# Patient Record
Sex: Male | Born: 1950 | Race: White | Hispanic: No | Marital: Married | Smoking: Former smoker
Health system: Southern US, Community
[De-identification: ages and names within clinical notes are randomized; demographics above are authoritative.]

## PROBLEM LIST (undated history)

## (undated) DIAGNOSIS — I10 Essential (primary) hypertension: Secondary | ICD-10-CM

## (undated) DIAGNOSIS — Z8639 Personal history of other endocrine, nutritional and metabolic disease: Secondary | ICD-10-CM

## (undated) DIAGNOSIS — B019 Varicella without complication: Secondary | ICD-10-CM

## (undated) DIAGNOSIS — M722 Plantar fascial fibromatosis: Secondary | ICD-10-CM

## (undated) HISTORY — DX: Plantar fascial fibromatosis: M72.2

## (undated) HISTORY — DX: Personal history of other endocrine, nutritional and metabolic disease: Z86.39

## (undated) HISTORY — PX: TONSILLECTOMY AND ADENOIDECTOMY: SHX28

## (undated) HISTORY — PX: KNEE ARTHROSCOPY: SUR90

## (undated) HISTORY — PX: TONSILLECTOMY: SUR1361

## (undated) HISTORY — DX: Varicella without complication: B01.9

## (undated) HISTORY — DX: Essential (primary) hypertension: I10

## (undated) HISTORY — PX: OTHER SURGICAL HISTORY: SHX169

---

## 2004-04-03 ENCOUNTER — Other Ambulatory Visit: Payer: Self-pay

## 2007-11-18 HISTORY — PX: FOOT SURGERY: SHX648

## 2008-01-14 ENCOUNTER — Ambulatory Visit: Payer: Self-pay | Admitting: Internal Medicine

## 2008-06-08 ENCOUNTER — Ambulatory Visit: Payer: Self-pay | Admitting: Internal Medicine

## 2009-07-16 IMAGING — US ABDOMEN ULTRASOUND
1 series · 17 of 25 positions shown · non-contrast
Comparison: none

REASON FOR EXAM: Elevated LFT's, elevated Bilirubin
COMMENTS:

[Series 1: abdomen ultrasound · 17 of 57 slices shown]
[im 1/57]
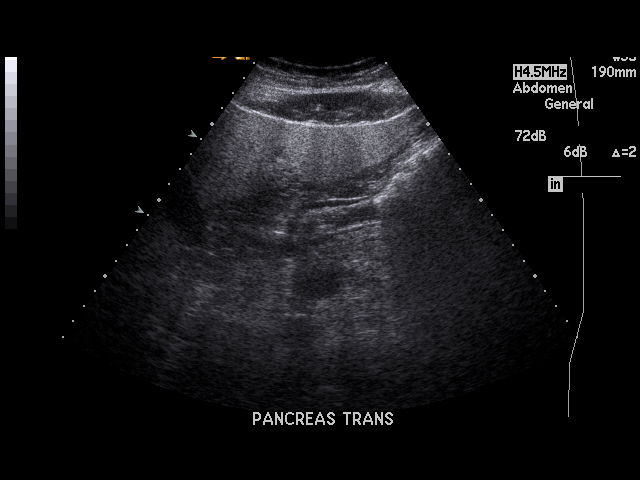
[im 5/57]
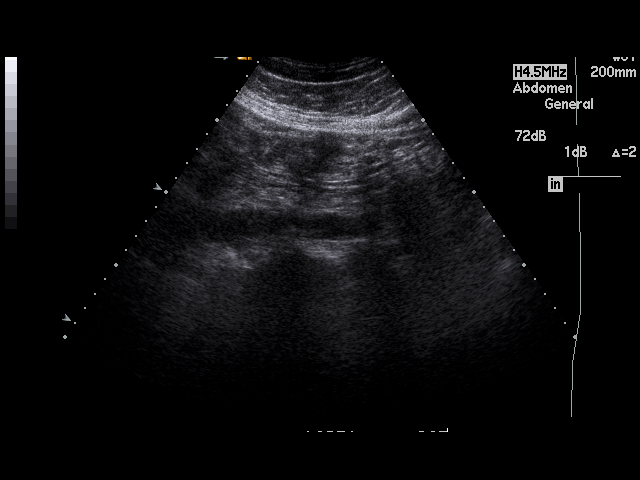
[im 8/57]
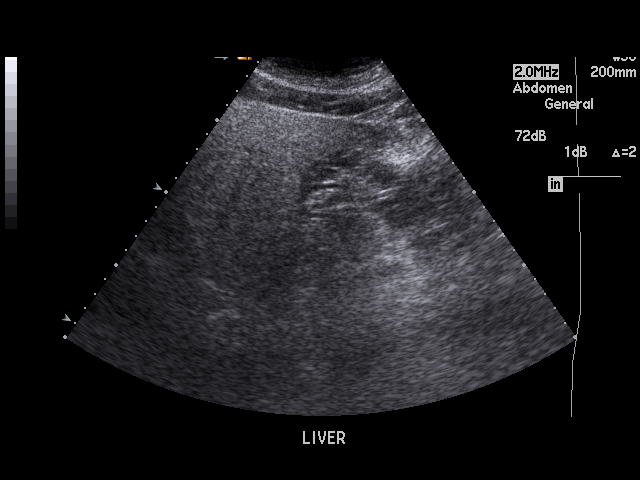
[im 12/57]
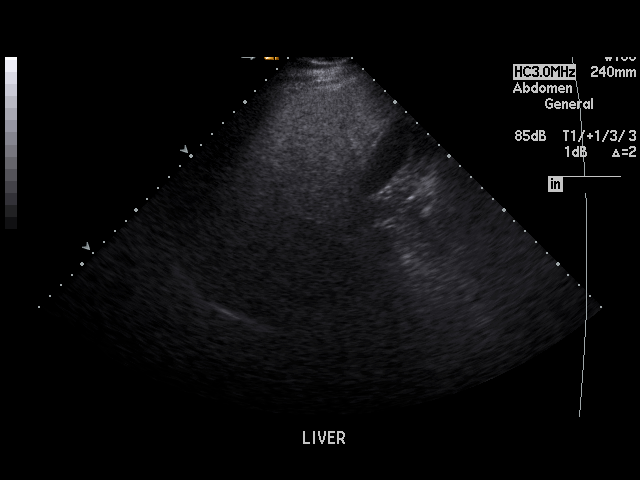
[im 15/57]
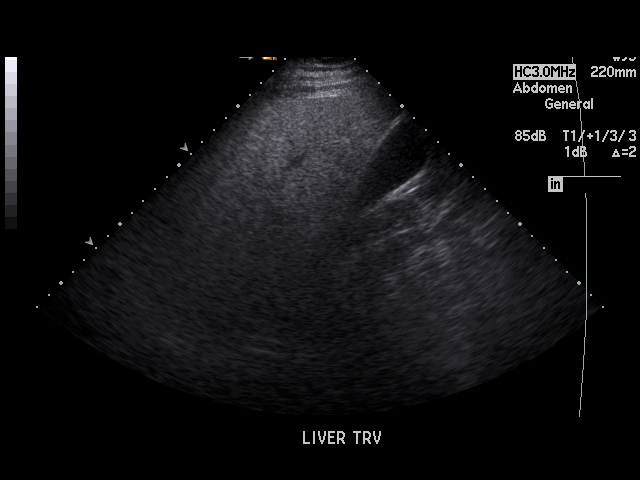
[im 19/57]
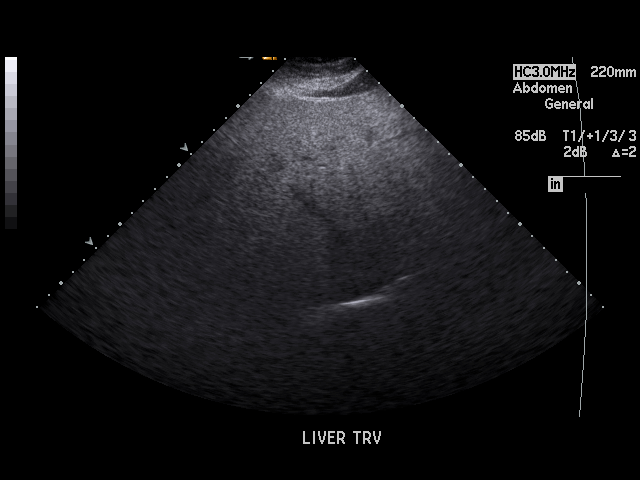
[im 22/57]
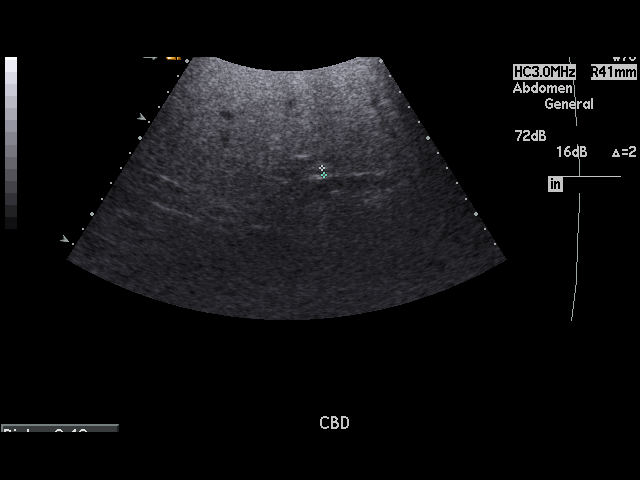
[im 26/57]
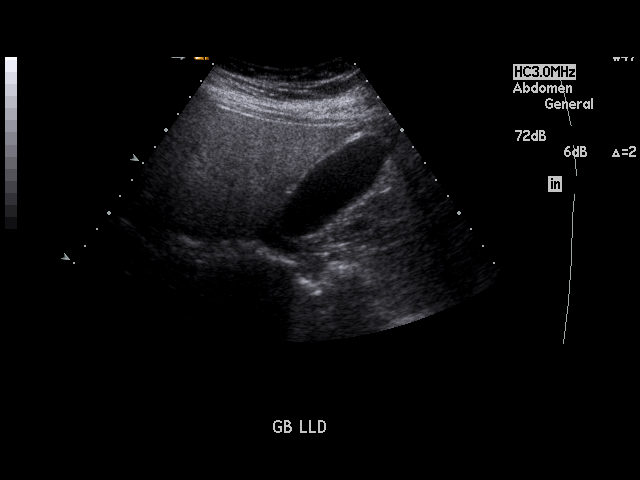
[im 29/57]
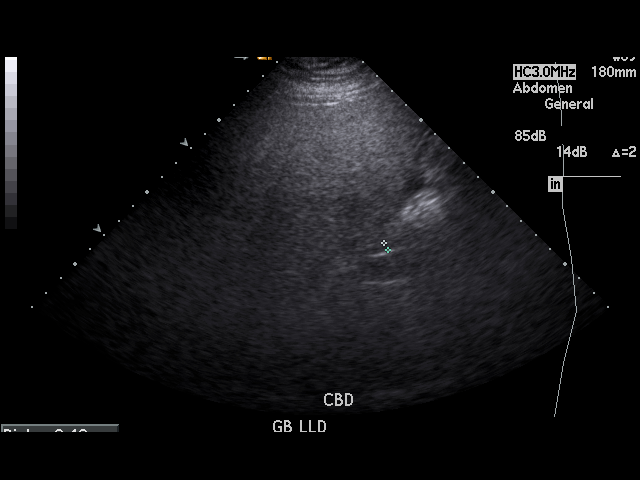
[im 31/57]
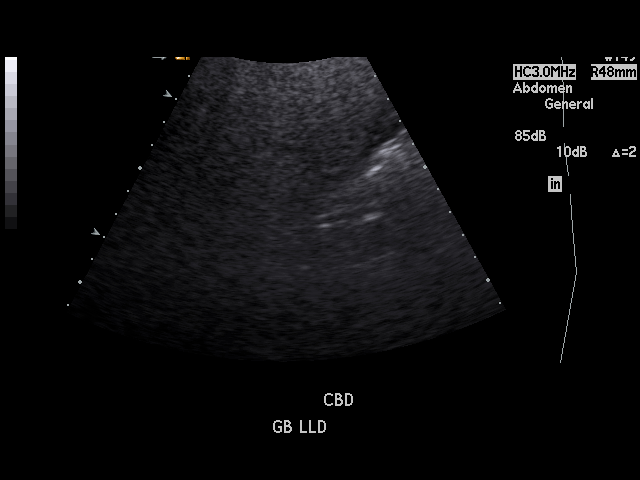
[im 36/57]
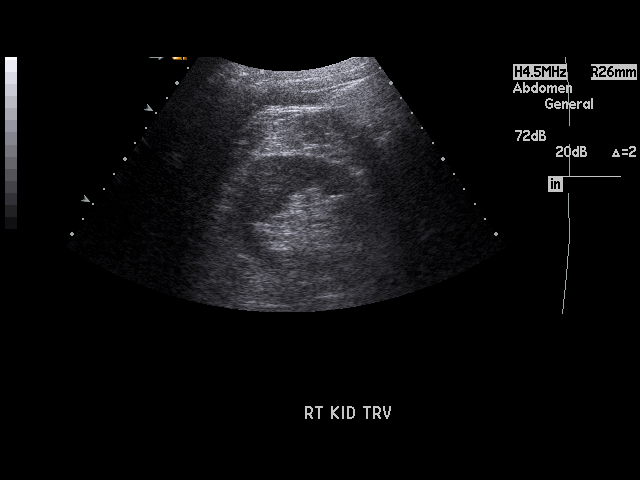
[im 38/57]
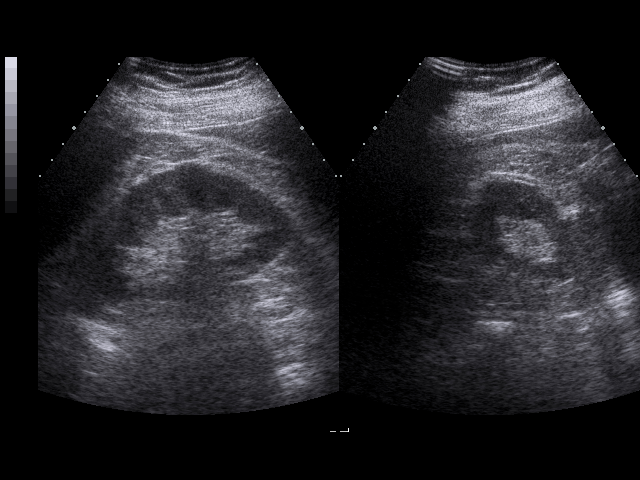
[im 43/57]
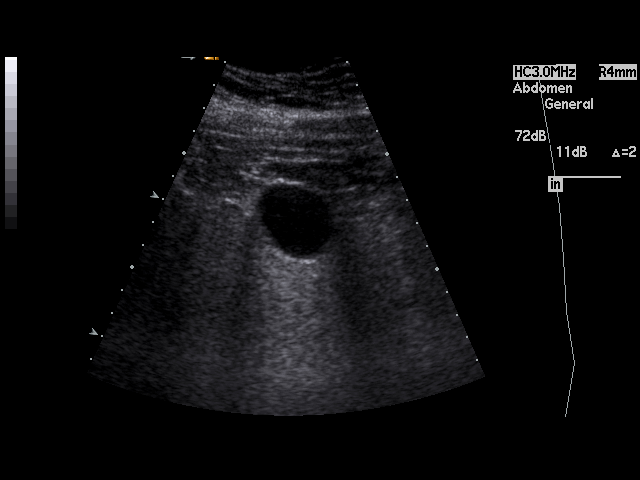
[im 45/57]
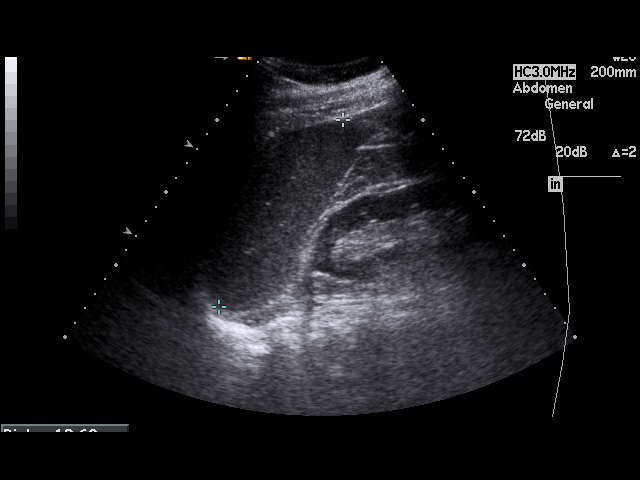
[im 50/57]
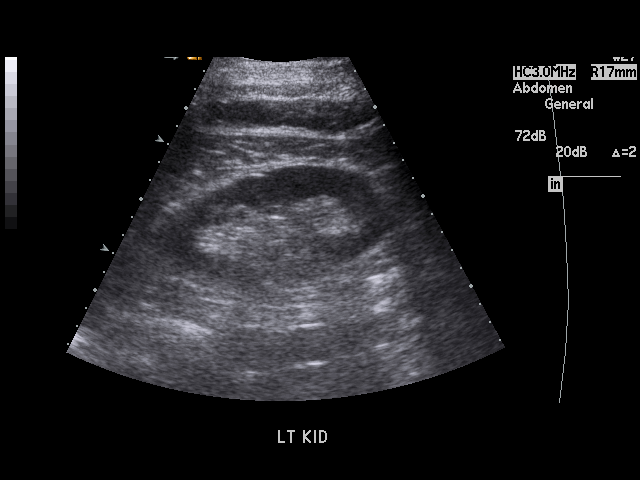
[im 52/57]
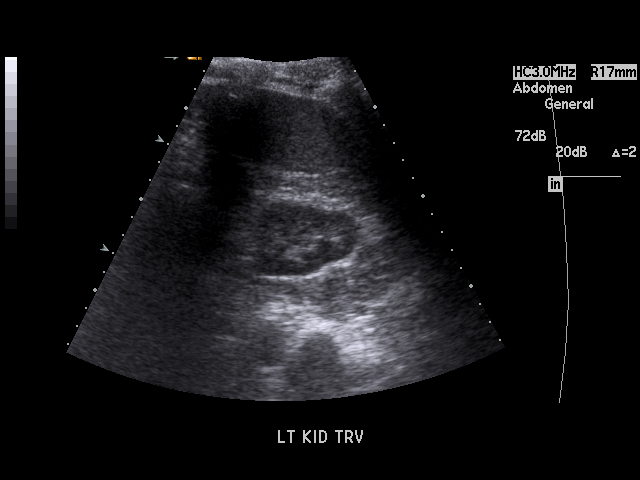
[im 57/57]
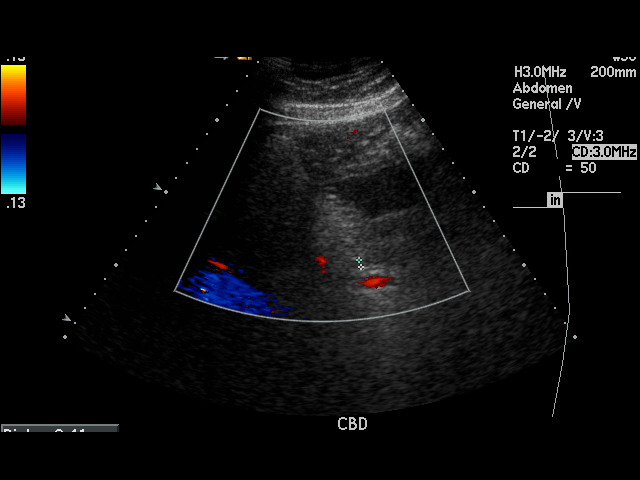

[17 of 25 positions shown; findings below may reference images not displayed]

PROCEDURE:     US  - US ABDOMEN GENERAL SURVEY  - January 14, 2008  [DATE]

RESULT:     Sonographic evaluation of the abdomen demonstrates increased
echogenicity within the liver suggestive of diffuse fatty infiltration. A
discrete mass could not be observed. The gallbladder appears to be normal.
The common bile duct diameter is 4.0 mm. The pancreas is poorly
demonstrated. The spleen, aorta and kidneys appear to be normal.
IMPRESSION: 1.  Evidence of fatty infiltration of the liver.
2.  Poor visualization of the pancreas.
3.  Otherwise unremarkable exam.

## 2009-12-09 IMAGING — US US RENAL KIDNEY
1 series · 17 of 20 positions shown · non-contrast
Comparison: none

REASON FOR EXAM: renal insufficiency
COMMENTS:

[Series 1: us renal kidney · 17 of 20 slices shown]
[im 1/20]
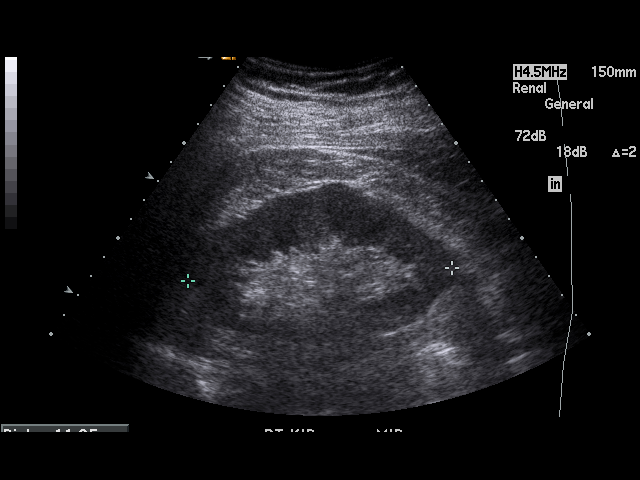
[im 2/20]
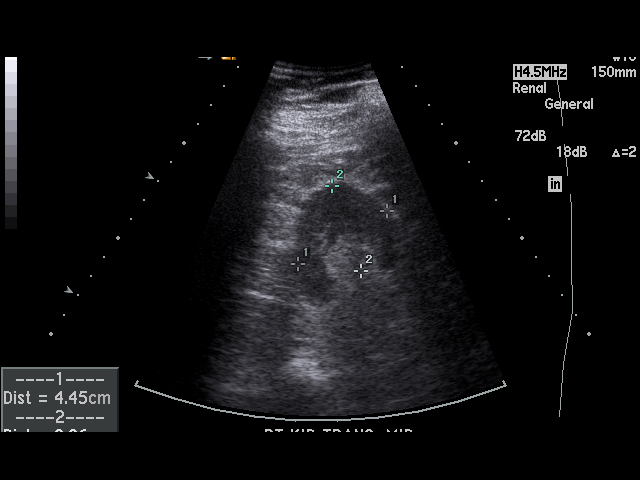
[im 3/20]
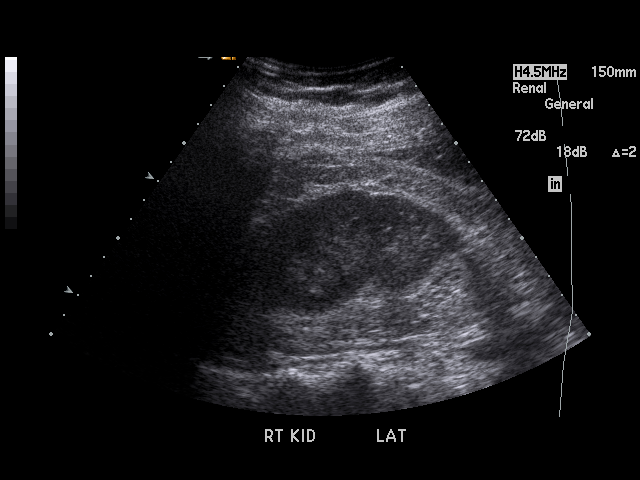
[im 5/20]
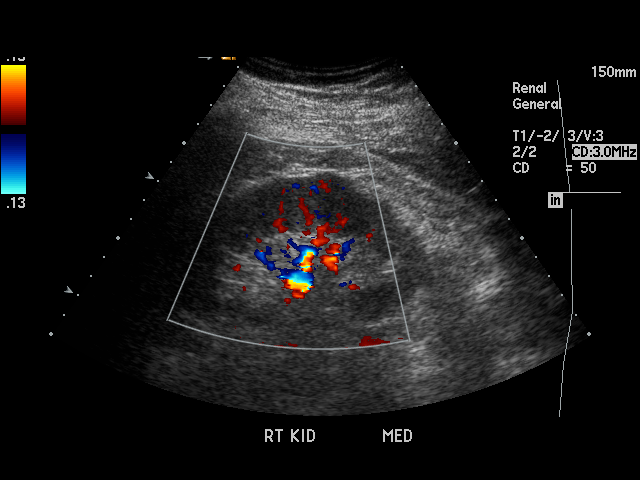
[im 6/20]
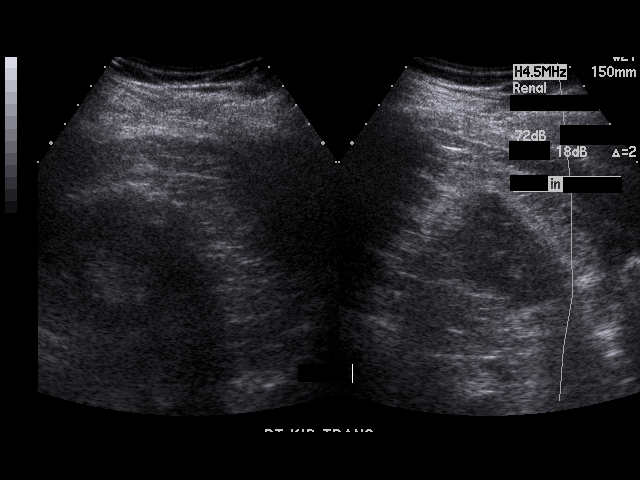
[im 7/20]
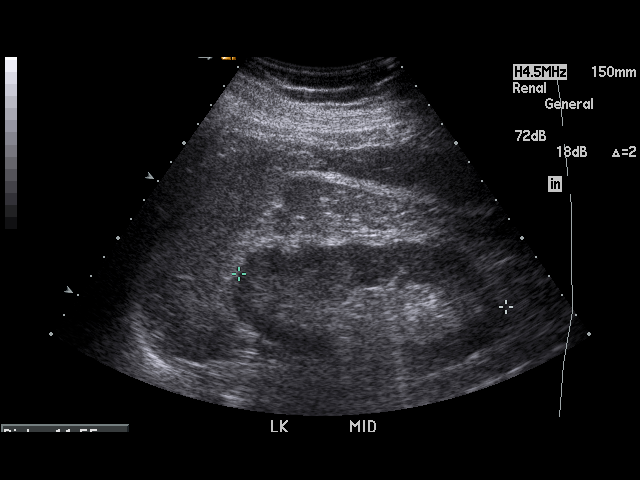
[im 8/20]
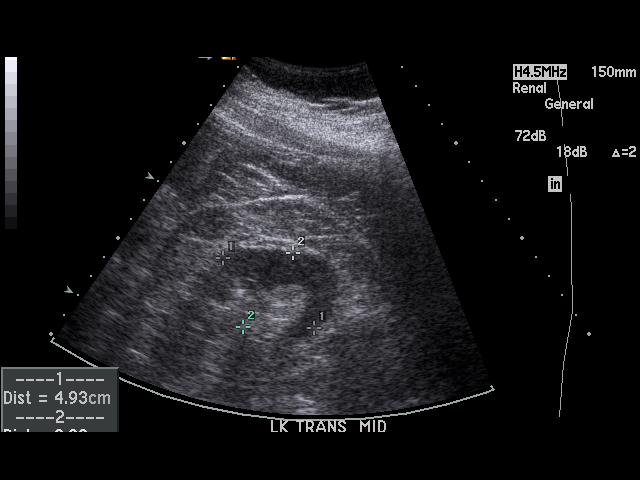
[im 9/20]
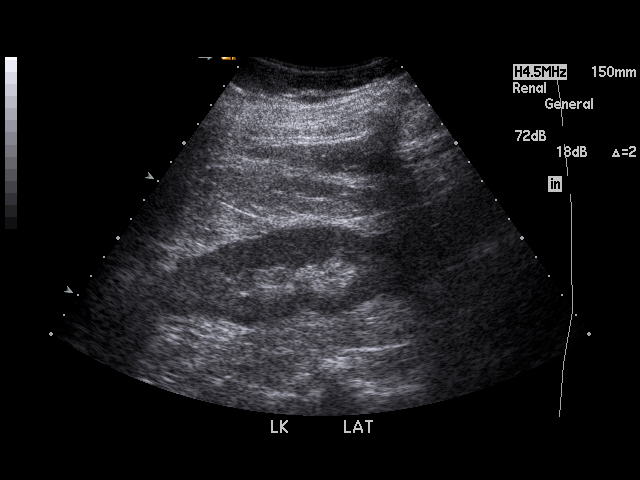
[im 11/20]
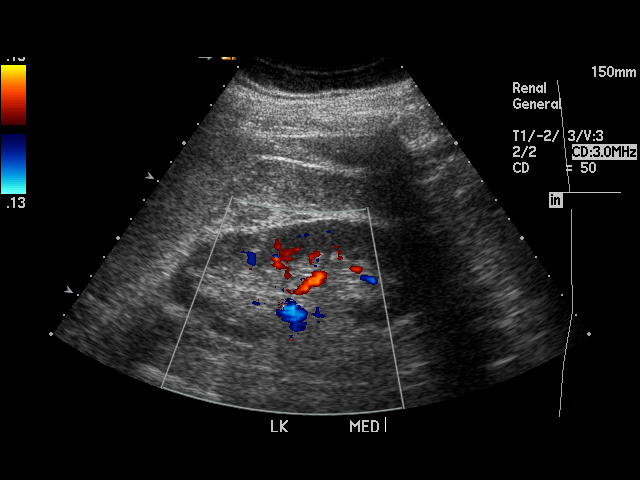
[im 12/20]
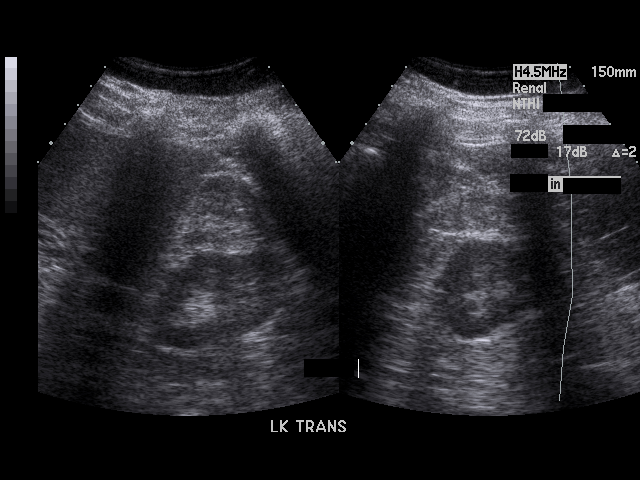
[im 13/20]
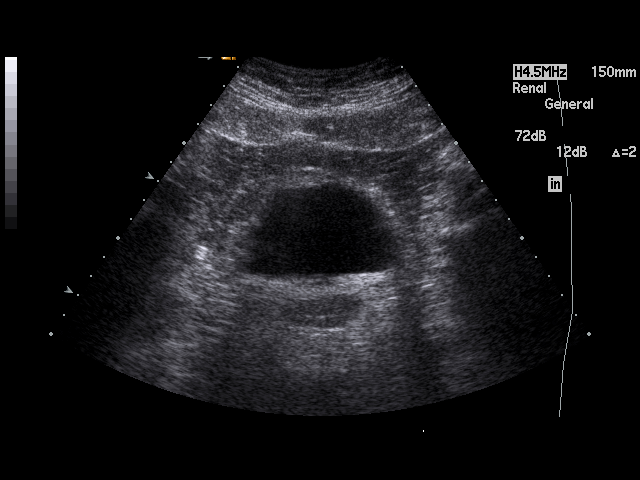
[im 14/20]
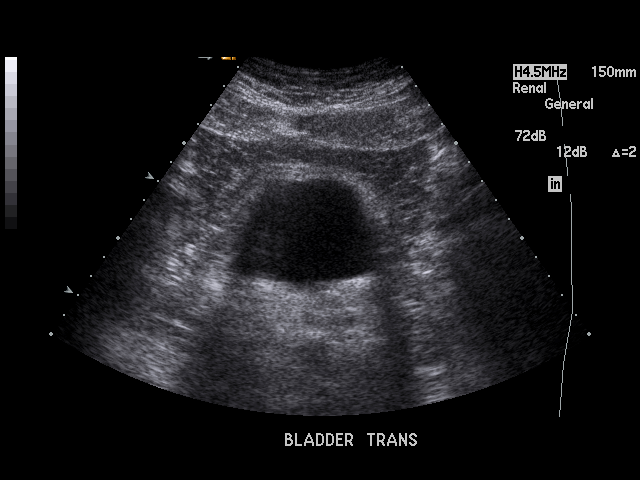
[im 15/20]
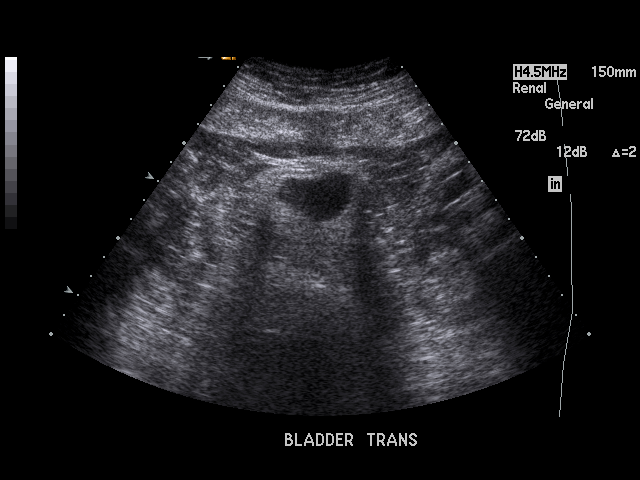
[im 16/20]
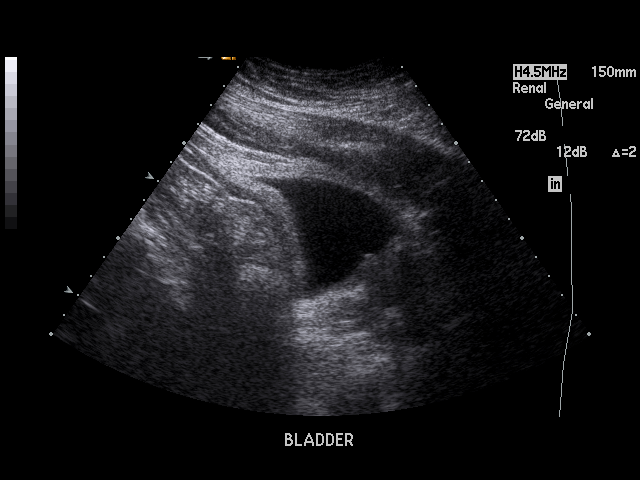
[im 18/20]
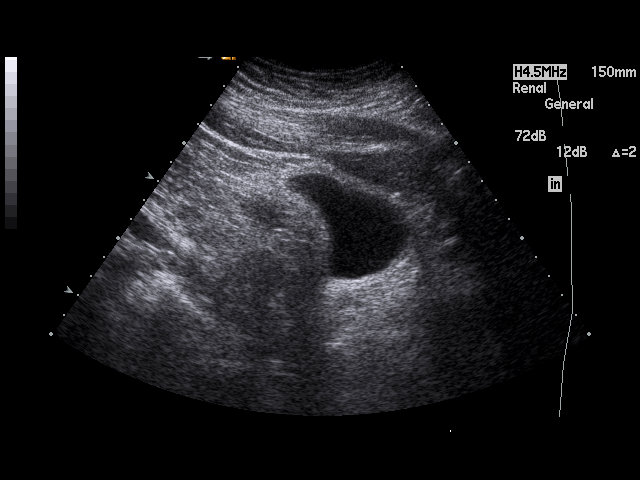
[im 19/20]
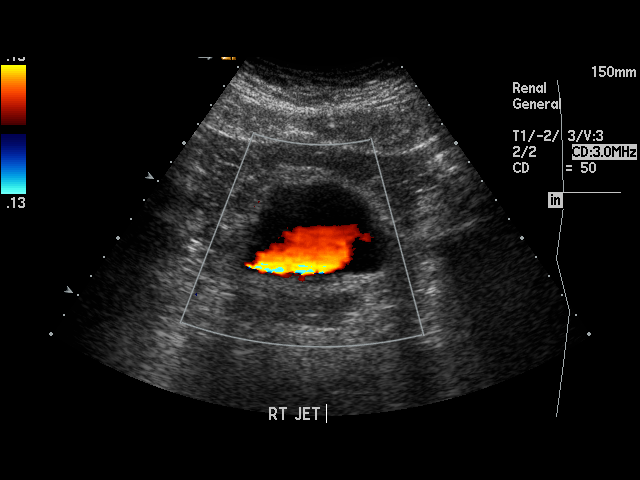
[im 20/20]
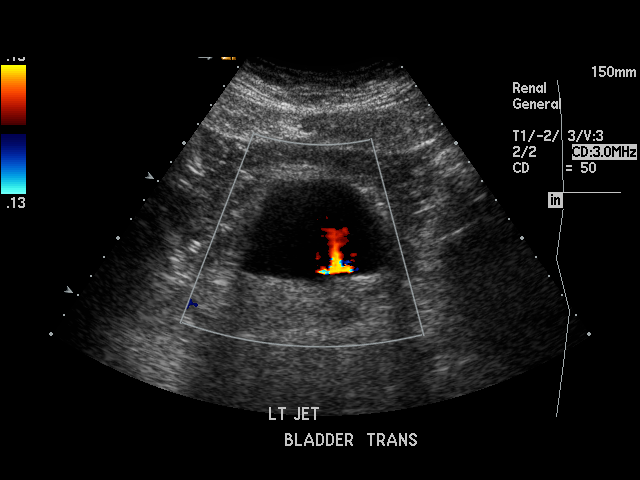

[17 of 20 positions shown; findings below may reference images not displayed]

PROCEDURE:     US  - US KIDNEY  - June 08, 2008  [DATE]

RESULT:     Sonographic investigation of the kidneys demonstrates the
presence of a small amount of urine in the bladder. There is no evidence of
hydronephrosis. The echotexture of the kidneys appear to be within normal
limits. The RIGHT kidney measures 11.4 x 4.5 x 3.9 cm. The LEFT kidney
measures 11.6 x 4.9 x 3.8 cm.
IMPRESSION: Normal appearing renal sonogram.

## 2010-11-17 HISTORY — PX: PLANTAR FASCIECTOMY: SUR600

## 2011-04-18 ENCOUNTER — Ambulatory Visit: Payer: Self-pay | Admitting: Internal Medicine

## 2011-04-25 LAB — PROTIME-INR: INR: 1 (ref 0.9–1.1)

## 2011-12-29 ENCOUNTER — Emergency Department: Payer: Self-pay | Admitting: Emergency Medicine

## 2012-03-18 LAB — LIPID PANEL
Cholesterol: 154 mg/dL (ref 0–200)
LDL Cholesterol: 95 mg/dL
Triglycerides: 128 mg/dL (ref 40–160)

## 2012-03-18 LAB — CBC AND DIFFERENTIAL
HCT: 45 % (ref 41–53)
Hemoglobin: 16.5 g/dL (ref 13.5–17.5)

## 2012-03-18 LAB — BASIC METABOLIC PANEL: Creatinine: 1.1 mg/dL (ref <=1.3)

## 2012-04-13 LAB — HEPATIC FUNCTION PANEL
Alkaline Phosphatase: 64 U/L (ref 25–125)
Bilirubin, Total: 1.4 mg/dL

## 2012-10-18 IMAGING — US ABDOMEN ULTRASOUND LIMITED
1 series · 17 of 25 positions shown · non-contrast
Comparison: none

REASON FOR EXAM: abnormal liver functions
COMMENTS:

PROCEDURE:     PIET-HEIN - PIET-HEIN ABDOMEN UPPER GENERAL  - April 18, 2011 [DATE]
RESULT:     Comparison: None
TECHNIQUE: Multiple gray-scale and color-flow Doppler images of the abdomen
are presented for review.

[Series 1: abdomen ultrasound limited · 17 of 58 slices shown]
[im 1/58]
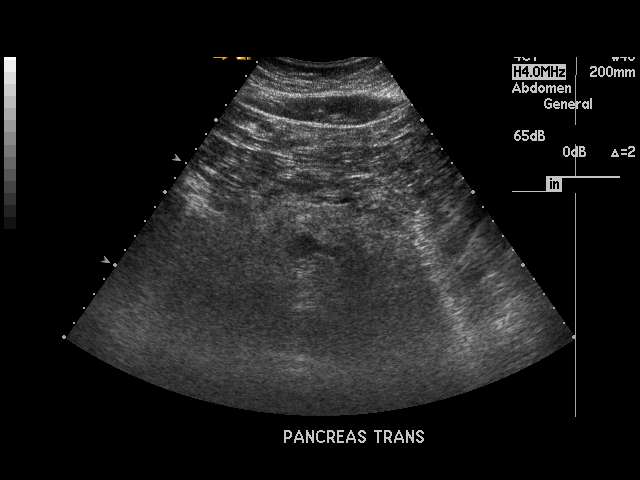
[im 5/58]
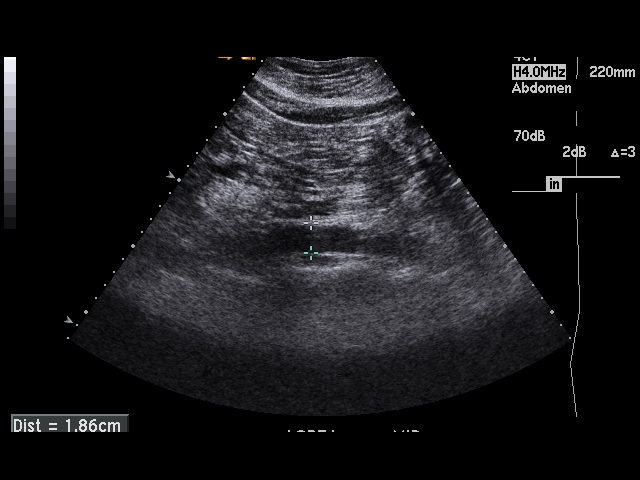
[im 8/58]
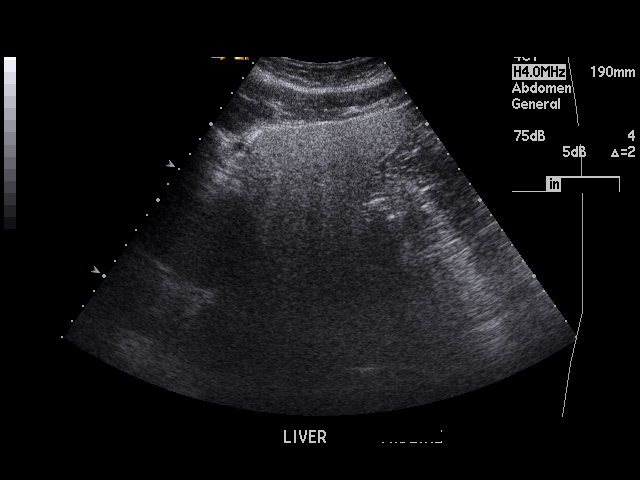
[im 12/58]
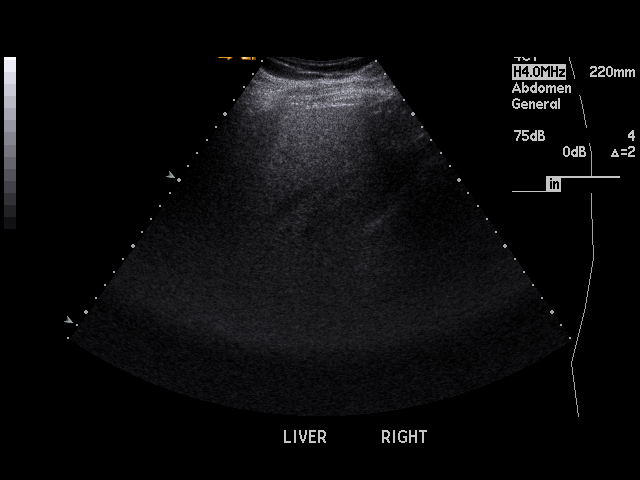
[im 15/58]
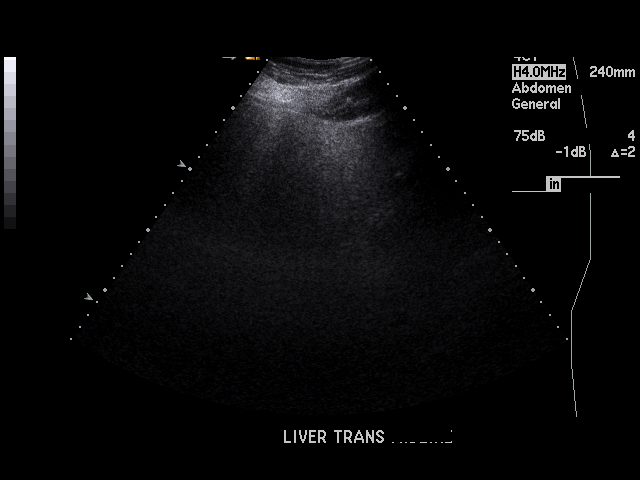
[im 20/58]
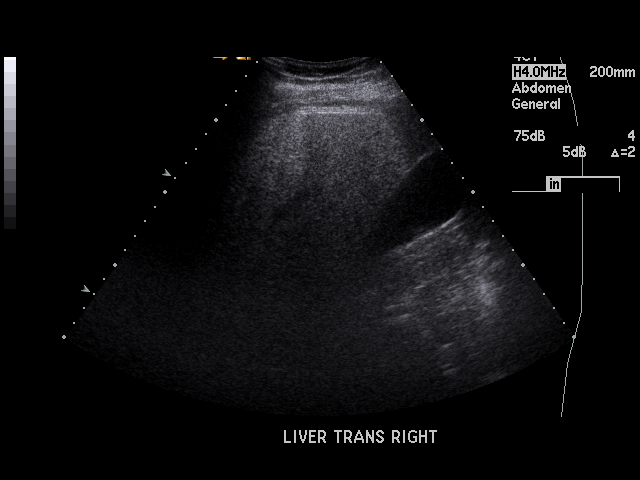
[im 22/58]
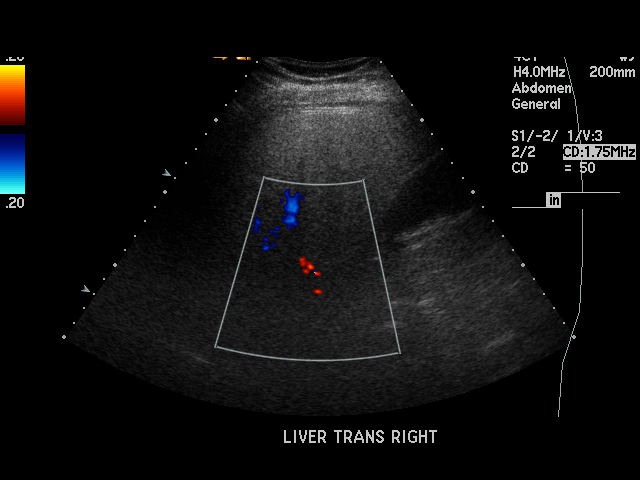
[im 27/58]
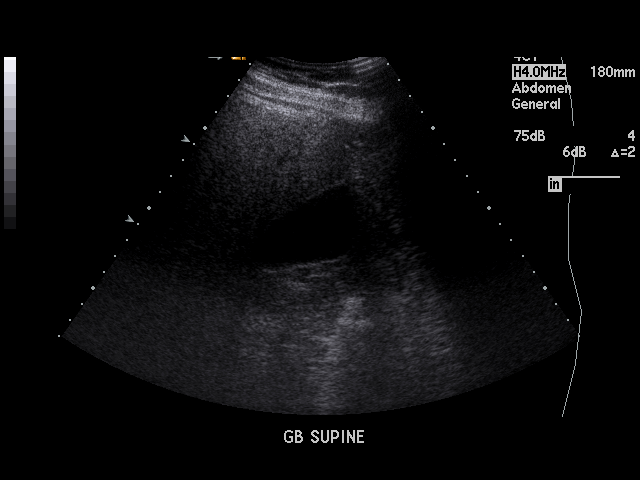
[im 29/58]
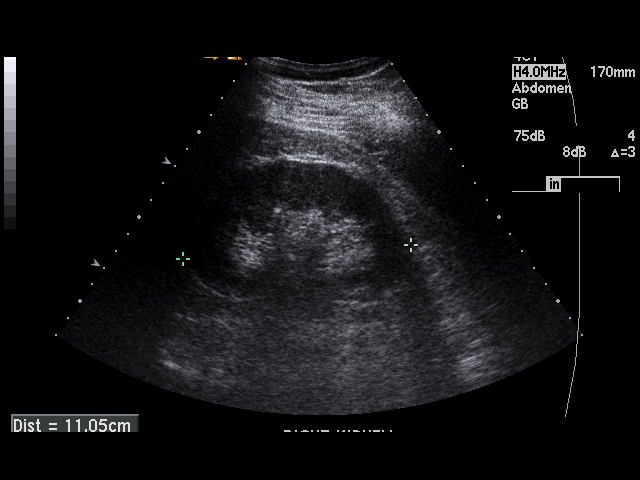
[im 31/58]
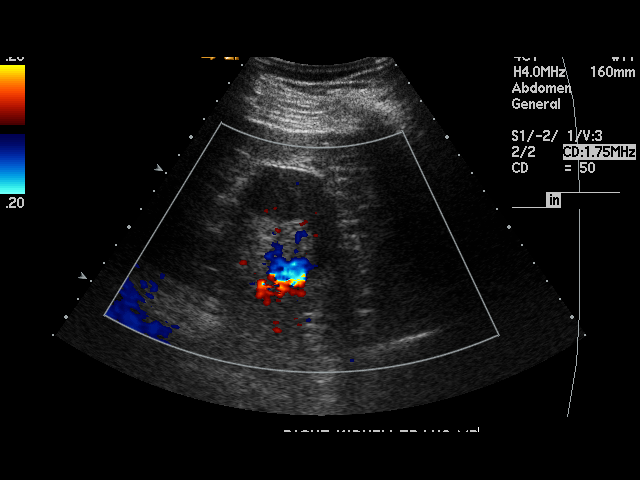
[im 36/58]
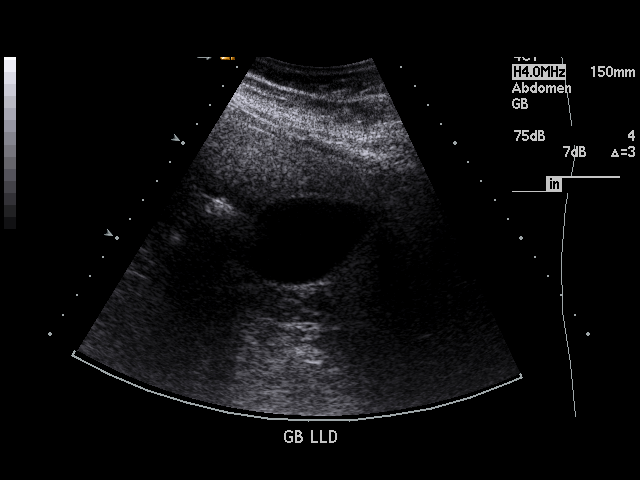
[im 39/58]
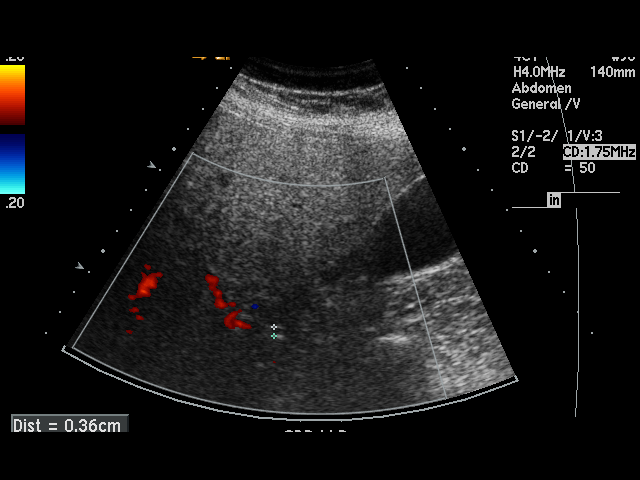
[im 43/58]
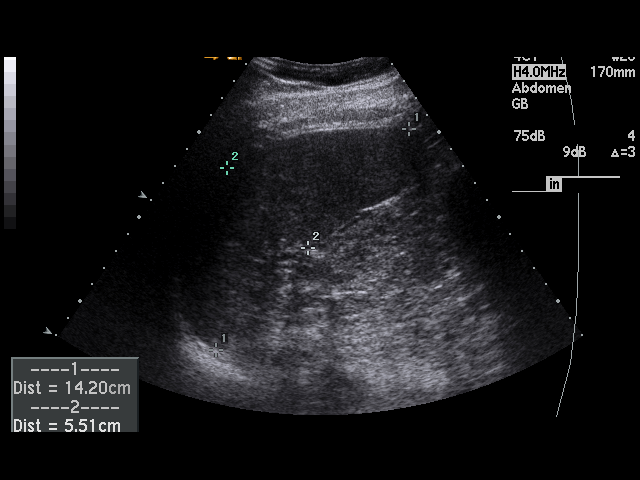
[im 46/58]
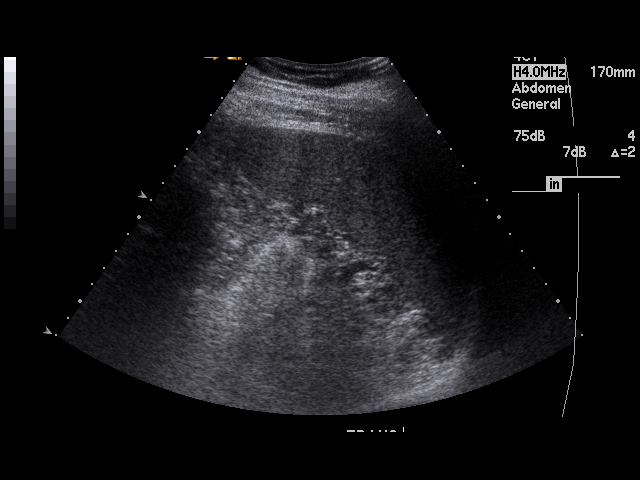
[im 50/58]
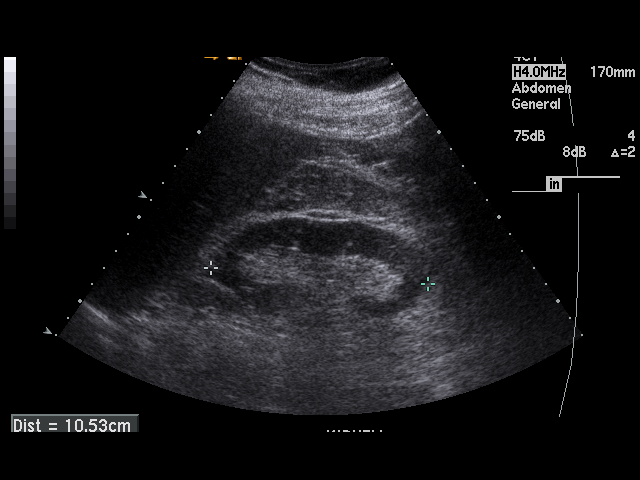
[im 53/58]
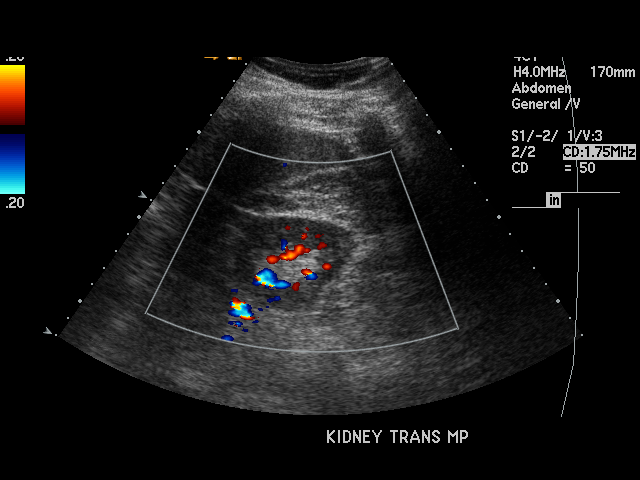
[im 58/58]
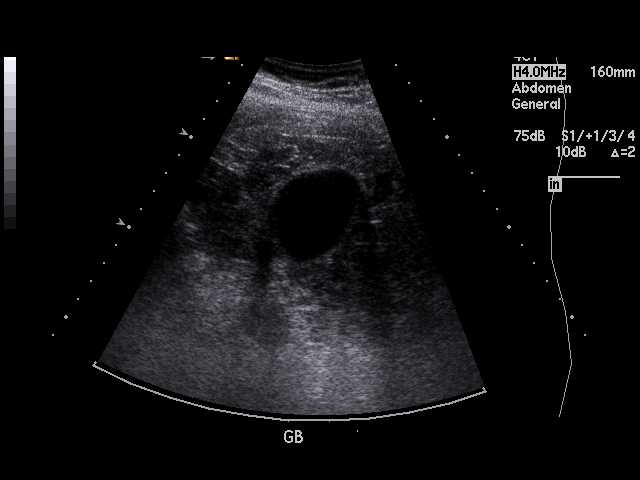

[17 of 25 positions shown; findings below may reference images not displayed]

FINDINGS: The liver is diffusely increased in echogenicity as can be seen with hepatic
steatosis. The liver is without evidence of a focal hepatic lesion.

There is no cholelithiasis or biliary sludge. There is no intra- or
extrahepatic biliary ductal dilatation. The common duct measures 3.6 mm in
maximal diameter. There is no gallbladder wall thickening, pericholecystic
fluid, or sonographic Murphy's sign.

The visualized portion of the pancreas is normal in echogenicity. The spleen
is enlarged measuring 15.3 cm without focal abnormality. Bilateral kidneys
are normal in echogenicity and size. The right kidney measures 11.1 x 5.5 x
5.2 cm. The left kidney measures 10.5 x 5.2 x 5.7 cm. There are no renal
calculi or hydronephrosis. The abdominal aorta is  unremarkable. The IVC is
not visualized.
IMPRESSION: 1. Splenomegaly.
2. Hepatic steatosis.

## 2012-12-02 ENCOUNTER — Encounter: Payer: Self-pay | Admitting: *Deleted

## 2012-12-03 ENCOUNTER — Encounter: Payer: Self-pay | Admitting: Internal Medicine

## 2012-12-03 ENCOUNTER — Ambulatory Visit (INDEPENDENT_AMBULATORY_CARE_PROVIDER_SITE_OTHER): Payer: Managed Care, Other (non HMO) | Admitting: Internal Medicine

## 2012-12-03 VITALS — BP 132/84 | HR 67 | Temp 98.1°F | Ht 69.0 in | Wt 245.0 lb

## 2012-12-03 DIAGNOSIS — R7989 Other specified abnormal findings of blood chemistry: Secondary | ICD-10-CM

## 2012-12-03 DIAGNOSIS — E1165 Type 2 diabetes mellitus with hyperglycemia: Secondary | ICD-10-CM | POA: Insufficient documentation

## 2012-12-03 DIAGNOSIS — R319 Hematuria, unspecified: Secondary | ICD-10-CM

## 2012-12-03 DIAGNOSIS — R739 Hyperglycemia, unspecified: Secondary | ICD-10-CM

## 2012-12-03 DIAGNOSIS — E7800 Pure hypercholesterolemia, unspecified: Secondary | ICD-10-CM | POA: Insufficient documentation

## 2012-12-03 DIAGNOSIS — R945 Abnormal results of liver function studies: Secondary | ICD-10-CM

## 2012-12-03 DIAGNOSIS — E119 Type 2 diabetes mellitus without complications: Secondary | ICD-10-CM | POA: Insufficient documentation

## 2012-12-03 DIAGNOSIS — R17 Unspecified jaundice: Secondary | ICD-10-CM

## 2012-12-03 DIAGNOSIS — R7309 Other abnormal glucose: Secondary | ICD-10-CM

## 2012-12-03 DIAGNOSIS — I10 Essential (primary) hypertension: Secondary | ICD-10-CM

## 2012-12-03 LAB — HEMOGLOBIN A1C: Hgb A1c MFr Bld: 6.3 % (ref 4.6–6.5)

## 2012-12-03 LAB — HEPATIC FUNCTION PANEL
Albumin: 4.1 g/dL (ref 3.5–5.2)
Alkaline Phosphatase: 74 U/L (ref 39–117)
Total Bilirubin: 1.9 mg/dL — ABNORMAL HIGH (ref 0.3–1.2)

## 2012-12-03 LAB — LIPID PANEL
HDL: 28.9 mg/dL — ABNORMAL LOW (ref >39.00)
LDL Cholesterol: 88 mg/dL (ref 0–99)
Total CHOL/HDL Ratio: 5
Triglycerides: 101 mg/dL (ref 0.0–149.0)

## 2012-12-03 LAB — BASIC METABOLIC PANEL
CO2: 27 mEq/L (ref 19–32)
Chloride: 104 mEq/L (ref 96–112)
Creatinine, Ser: 1.1 mg/dL (ref 0.4–1.5)
Glucose, Bld: 160 mg/dL — ABNORMAL HIGH (ref 70–99)

## 2012-12-04 ENCOUNTER — Encounter: Payer: Self-pay | Admitting: Internal Medicine

## 2012-12-04 DIAGNOSIS — R945 Abnormal results of liver function studies: Secondary | ICD-10-CM | POA: Insufficient documentation

## 2012-12-04 DIAGNOSIS — R319 Hematuria, unspecified: Secondary | ICD-10-CM | POA: Insufficient documentation

## 2012-12-04 MED ORDER — LISINOPRIL-HYDROCHLOROTHIAZIDE 10-12.5 MG PO TABS: 1.0000 | ORAL_TABLET | Freq: Every day | ORAL | Status: DC

## 2012-12-04 MED ORDER — BISOPROLOL-HYDROCHLOROTHIAZIDE 10-6.25 MG PO TABS: 1.0000 | ORAL_TABLET | Freq: Every day | ORAL | Status: DC

## 2012-12-04 NOTE — Assessment & Plan Note (Signed)
U/a checked 03/18/12 revealed no rbc's.  Has previously declined referral to urology.  Follow.

## 2012-12-04 NOTE — Progress Notes (Signed)
  Subjective:    Patient ID: Jamie Martin, male    DOB: July 19, 1951, 62 y.o.   MRN: [id number]  HPI 62 year old male with past history of hypertension and hypercholesterolemia who comes in today for a scheduled follow up.  States he has been doing well.  No cardiac symptoms with increased activity or exertion.  Breathing stable.  No sob.  Tries to stay active.  No bowel changes.  Trying to watch what he eats.    Past Medical History  Diagnosis Date  . History of hyperglycemia   . Hypertension   . Plantar fasciitis     History of  . Chicken pox     Current Outpatient Prescriptions on File Prior to Visit  Medication Sig Dispense Refill  . Aspirin 81 MG EC tablet Take 81 mg by mouth daily.      . bisoprolol-hydrochlorothiazide (ZIAC) 10-6.25 MG per tablet Take 1 tablet by mouth daily.  90 tablet  3  . lisinopril-hydrochlorothiazide (PRINZIDE,ZESTORETIC) 10-12.5 MG per tablet Take 1 tablet by mouth daily.  90 tablet  3    Review of Systems Patient denies any headache, lightheadedness or dizziness.  No sinus or allergy symptoms.  No chest pain, tightness or palpitations.  No increased shortness of breath, cough or congestion.  No nausea or vomiting. No acid reflux.  No abdominal pain or cramping.  No bowel change, such as diarrhea, constipation, BRBPR or melana.  No urine change.  Had his flu shot in 10/13.  Up to date with tetanus.       Objective:   Physical Exam Filed Vitals:   12/03/12 0809  BP: 132/84  Pulse: 67  Temp: 98.1 F (53.69 C)   62 year old male in no acute distress.   HEENT:  Nares - clear.  OP- without lesions or erythema.  NECK:  Supple, nontender.  No audible bruit.   HEART:  Appears to be regular. LUNGS:  Without crackles or wheezing audible.  Respirations even and unlabored.   RADIAL PULSE:  Equal bilaterally.  ABDOMEN:  Soft, nontender.  No audible abdominal bruit.   EXTREMITIES:  No increased edema to be present.                  Assessment & Plan:    CARDIOVASCULAR.  Currently asymptomatic.  Continue risk factor modification.   HEALTH MAINTENANCE.  Physical 03/22/12.  He declines colonoscopy and hemoccult cards.  PSA 03/18/12 - 1.82.  Tetanus 2013.

## 2012-12-04 NOTE — Assessment & Plan Note (Signed)
Low carb diet, exercise and weight loss.  Check metabolic panel and a1c.   

## 2012-12-04 NOTE — Assessment & Plan Note (Signed)
Blood pressure under good control.  Same medication.  Check metabolic panel.    

## 2012-12-04 NOTE — Assessment & Plan Note (Signed)
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   

## 2012-12-04 NOTE — Assessment & Plan Note (Signed)
Low cholesterol diet.  Check lipid panel.    

## 2012-12-04 NOTE — Assessment & Plan Note (Signed)
Has been evaluated by Dr Marva Panda.  Felt to be due to fatty liver.  Recommended following liver panel.  Continue vitamin E.  Has had his hepatitis vaccines.  Discussed importance of weight loss and diet - to keep sugars under control.

## 2012-12-15 ENCOUNTER — Telehealth: Payer: Self-pay | Admitting: Internal Medicine

## 2012-12-15 ENCOUNTER — Ambulatory Visit: Payer: Managed Care, Other (non HMO) | Admitting: Internal Medicine

## 2012-12-15 NOTE — Telephone Encounter (Signed)
Please call pt and confirm that he is doing ok.  There is no message in the chart regarding his symptoms.  Tell him we will try to work him in this week.  If any acute problems - he needs evaluation.

## 2012-12-15 NOTE — Telephone Encounter (Signed)
Patient said that he feels alright today and  that he is fine to wait to see you on Monday.

## 2012-12-15 NOTE — Telephone Encounter (Signed)
Called pt to r/s his appointment from this morning.  R/s to Monday 2/3 @ 2.  Pt wanted to know if he could be seen sooner.  I tried to send him to call a nurse.  Pt refused stating he spoke with them yesterday

## 2012-12-20 ENCOUNTER — Ambulatory Visit: Payer: Managed Care, Other (non HMO) | Admitting: Internal Medicine

## 2012-12-20 NOTE — Telephone Encounter (Signed)
I called patient to see if he could come in and see Dr. Lorin Picket on Wednesday he states that he is feeling better and doesn't need to be seen. He didn't make an appointment for Wednesday.

## 2012-12-21 ENCOUNTER — Ambulatory Visit: Payer: Managed Care, Other (non HMO) | Admitting: Internal Medicine

## 2013-05-03 ENCOUNTER — Encounter: Payer: Self-pay | Admitting: Internal Medicine

## 2013-05-03 ENCOUNTER — Ambulatory Visit (INDEPENDENT_AMBULATORY_CARE_PROVIDER_SITE_OTHER): Payer: Managed Care, Other (non HMO) | Admitting: Internal Medicine

## 2013-05-03 VITALS — BP 110/70 | HR 83 | Temp 98.0°F | Ht 67.75 in | Wt 248.8 lb

## 2013-05-03 DIAGNOSIS — R945 Abnormal results of liver function studies: Secondary | ICD-10-CM

## 2013-05-03 DIAGNOSIS — R319 Hematuria, unspecified: Secondary | ICD-10-CM

## 2013-05-03 DIAGNOSIS — Z125 Encounter for screening for malignant neoplasm of prostate: Secondary | ICD-10-CM

## 2013-05-03 DIAGNOSIS — R7309 Other abnormal glucose: Secondary | ICD-10-CM

## 2013-05-03 DIAGNOSIS — R17 Unspecified jaundice: Secondary | ICD-10-CM

## 2013-05-03 DIAGNOSIS — Z1211 Encounter for screening for malignant neoplasm of colon: Secondary | ICD-10-CM

## 2013-05-03 DIAGNOSIS — E7800 Pure hypercholesterolemia, unspecified: Secondary | ICD-10-CM

## 2013-05-03 DIAGNOSIS — R7989 Other specified abnormal findings of blood chemistry: Secondary | ICD-10-CM

## 2013-05-03 DIAGNOSIS — I10 Essential (primary) hypertension: Secondary | ICD-10-CM

## 2013-05-03 DIAGNOSIS — R739 Hyperglycemia, unspecified: Secondary | ICD-10-CM

## 2013-05-03 LAB — URINALYSIS, ROUTINE W REFLEX MICROSCOPIC
Bilirubin Urine: NEGATIVE
Ketones, ur: NEGATIVE
Leukocytes, UA: NEGATIVE
Nitrite: NEGATIVE
Specific Gravity, Urine: >=1.03 (ref 1.000–1.030)
Total Protein, Urine: NEGATIVE
Urine Glucose: 100
Urobilinogen, UA: 0.2 (ref 0.0–1.0)
WBC, UA: NONE SEEN (ref 0–?)
pH: 5.5 (ref 5.0–8.0)

## 2013-05-03 LAB — HEMOGLOBIN A1C: Hgb A1c MFr Bld: 6.5 % (ref 4.6–6.5)

## 2013-05-03 LAB — CBC WITH DIFFERENTIAL/PLATELET
Basophils Absolute: 0 10*3/uL (ref 0.0–0.1)
Eosinophils Relative: 2.6 % (ref 0.0–5.0)
HCT: 46.7 % (ref 39.0–52.0)
Lymphocytes Relative: 33.2 % (ref 12.0–46.0)
Monocytes Relative: 9.2 % (ref 3.0–12.0)
Neutrophils Relative %: 54.6 % (ref 43.0–77.0)
Platelets: 221 10*3/uL (ref 150.0–400.0)
RDW: 12.9 % (ref 11.5–14.6)
WBC: 6.8 10*3/uL (ref 4.5–10.5)

## 2013-05-03 NOTE — Assessment & Plan Note (Signed)
Low carb diet, exercise and weight loss.  Check metabolic panel and a1c.   

## 2013-05-03 NOTE — Assessment & Plan Note (Signed)
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   

## 2013-05-03 NOTE — Assessment & Plan Note (Signed)
Recheck liver panel today.  

## 2013-05-03 NOTE — Progress Notes (Signed)
  Subjective:    Patient ID: Jamie Martin, male    DOB: January 19, 1951, 62 y.o.   MRN: [id number]  HPI 62 year old male with past history of hypertension and hypercholesterolemia who comes in today to follow up on these issues as well as for a complete physical exam.  States he has been doing well.  No cardiac symptoms with increased activity or exertion.  Breathing stable.  No sob.  Tries to stay active.  No bowel changes.  Trying to watch what he eats.  Had skin tags removed last week.  Followed by Dr Cheree Ditto.     Past Medical History  Diagnosis Date  . History of hyperglycemia   . Hypertension   . Plantar fasciitis     History of  . Chicken pox     Current Outpatient Prescriptions on File Prior to Visit  Medication Sig Dispense Refill  . Aspirin 81 MG EC tablet Take 81 mg by mouth daily.      . bisoprolol-hydrochlorothiazide (ZIAC) 10-6.25 MG per tablet Take 1 tablet by mouth daily.  90 tablet  3  . lisinopril-hydrochlorothiazide (PRINZIDE,ZESTORETIC) 10-12.5 MG per tablet Take 1 tablet by mouth daily.  90 tablet  3  . OMEGA 3 1000 MG CAPS Take by mouth 2 (two) times daily.      . vitamin E 400 UNIT capsule Take 400 Units by mouth daily.       No current facility-administered medications on file prior to visit.    Review of Systems Patient denies any headache, lightheadedness or dizziness.  No significant sinus or allergy symptoms.  No chest pain, tightness or palpitations.  No increased shortness of breath, cough or congestion.  No nausea or vomiting. No acid reflux.  No abdominal pain or cramping.  No bowel change, such as diarrhea, constipation, BRBPR or melana.  No urine change.  Staying active.       Objective:   Physical Exam  Filed Vitals:   05/03/13 0827  BP: 110/70  Pulse: 83  Temp: 98 F (36.7 C)   Blood pressure recheck:  128/82, pulse 22  62 year old male in no acute distress.  HEENT:  Nares - clear.  Oropharynx - without lesions. NECK:  Supple.  Nontender.  No  audible carotid bruit.  HEART:  Appears to be regular.   LUNGS:  No crackles or wheezing audible.  Respirations even and unlabored.   RADIAL PULSE:  Equal bilaterally.  ABDOMEN:  Soft.  Nontender.  Bowel sounds present and normal.  No audible abdominal bruit.  GU:  Normal descended testicles.  No palpable testicular nodules.   RECTAL:  Could not appreciate any palpable prostate nodules.  Heme negative.   EXTREMITIES:  No increased edema present.  DP pulses palpable and equal bilaterally.         Assessment & Plan:  CARDIOVASCULAR.  Currently asymptomatic.  Continue risk factor modification.   HEALTH MAINTENANCE.  Physical today.  He declines colonoscopy and hemoccult cards. Discussed with him today regarding the importance of screening.   PSA 03/18/12 - 1.82.  Check PSA with labs today.  Tetanus 2013.

## 2013-05-03 NOTE — Assessment & Plan Note (Signed)
Recheck urinalysis to confirm no blood.   

## 2013-05-03 NOTE — Assessment & Plan Note (Signed)
Blood pressure under good control.  Same medication.  Check metabolic panel.    

## 2013-05-03 NOTE — Assessment & Plan Note (Signed)
Low cholesterol diet.  Check lipid panel.    

## 2013-05-04 LAB — HEPATIC FUNCTION PANEL
ALT: 52 U/L (ref 0–53)
Bilirubin, Direct: 0.2 mg/dL (ref 0.0–0.3)
Total Bilirubin: 1.4 mg/dL — ABNORMAL HIGH (ref 0.3–1.2)
Total Protein: 7.3 g/dL (ref 6.0–8.3)

## 2013-05-04 LAB — MICROALBUMIN / CREATININE URINE RATIO
Creatinine,U: 138.4 mg/dL
Microalb, Ur: 0.4 mg/dL (ref 0.0–1.9)

## 2013-05-04 LAB — LIPID PANEL
HDL: 33.1 mg/dL — ABNORMAL LOW (ref >39.00)
Triglycerides: 205 mg/dL — ABNORMAL HIGH (ref 0.0–149.0)
VLDL: 41 mg/dL — ABNORMAL HIGH (ref 0.0–40.0)

## 2013-05-04 LAB — BASIC METABOLIC PANEL
CO2: 22 mEq/L (ref 19–32)
Calcium: 9.1 mg/dL (ref 8.4–10.5)
Chloride: 104 mEq/L (ref 96–112)
Glucose, Bld: 151 mg/dL — ABNORMAL HIGH (ref 70–99)
Potassium: 3.9 mEq/L (ref 3.5–5.1)
Sodium: 141 mEq/L (ref 135–145)

## 2013-05-04 LAB — PSA: PSA: 1.38 ng/mL (ref 0.10–4.00)

## 2013-05-10 ENCOUNTER — Encounter: Payer: Self-pay | Admitting: Internal Medicine

## 2013-08-06 ENCOUNTER — Encounter: Payer: Self-pay | Admitting: Internal Medicine

## 2013-11-02 ENCOUNTER — Ambulatory Visit (INDEPENDENT_AMBULATORY_CARE_PROVIDER_SITE_OTHER): Payer: Managed Care, Other (non HMO) | Admitting: Internal Medicine

## 2013-11-02 ENCOUNTER — Encounter: Payer: Self-pay | Admitting: Internal Medicine

## 2013-11-02 VITALS — BP 122/80 | HR 78 | Temp 98.3°F | Ht 67.75 in | Wt 246.5 lb

## 2013-11-02 DIAGNOSIS — N529 Male erectile dysfunction, unspecified: Secondary | ICD-10-CM

## 2013-11-02 DIAGNOSIS — E7800 Pure hypercholesterolemia, unspecified: Secondary | ICD-10-CM

## 2013-11-02 DIAGNOSIS — R17 Unspecified jaundice: Secondary | ICD-10-CM

## 2013-11-02 DIAGNOSIS — R7989 Other specified abnormal findings of blood chemistry: Secondary | ICD-10-CM

## 2013-11-02 DIAGNOSIS — R739 Hyperglycemia, unspecified: Secondary | ICD-10-CM

## 2013-11-02 DIAGNOSIS — R7309 Other abnormal glucose: Secondary | ICD-10-CM

## 2013-11-02 DIAGNOSIS — R319 Hematuria, unspecified: Secondary | ICD-10-CM

## 2013-11-02 DIAGNOSIS — R945 Abnormal results of liver function studies: Secondary | ICD-10-CM

## 2013-11-02 DIAGNOSIS — I10 Essential (primary) hypertension: Secondary | ICD-10-CM

## 2013-11-02 LAB — BASIC METABOLIC PANEL
BUN: 16 mg/dL (ref 6–23)
CO2: 24 mEq/L (ref 19–32)
Calcium: 9.3 mg/dL (ref 8.4–10.5)
GFR: 68.62 mL/min (ref >60.00)
Glucose, Bld: 154 mg/dL — ABNORMAL HIGH (ref 70–99)
Potassium: 4 mEq/L (ref 3.5–5.1)

## 2013-11-02 LAB — HEPATIC FUNCTION PANEL
ALT: 59 U/L — ABNORMAL HIGH (ref 0–53)
Bilirubin, Direct: 0.3 mg/dL (ref 0.0–0.3)
Total Bilirubin: 1.8 mg/dL — ABNORMAL HIGH (ref 0.3–1.2)

## 2013-11-02 LAB — LIPID PANEL
Total CHOL/HDL Ratio: 5
VLDL: 23.6 mg/dL (ref 0.0–40.0)

## 2013-11-02 MED ORDER — TADALAFIL 5 MG PO TABS: 5.0000 mg | ORAL_TABLET | Freq: Every day | ORAL | Status: DC | PRN

## 2013-11-02 NOTE — Progress Notes (Signed)
  Subjective:    Patient ID: Jamie Martin, male    DOB: Sep 03, 1951, 62 y.o.   MRN: [id number]  HPI 62 year old male with past history of hypertension and hypercholesterolemia who comes in today for a scheduled follow up.  States he has been doing well.  No cardiac symptoms with increased activity or exertion.  Breathing stable.  No sob.  Tries to stay active.  No bowel changes.  Trying to watch what he eats.  Overall feels good.  States having trouble sustaining an erection.      Past Medical History  Diagnosis Date  . History of hyperglycemia   . Hypertension   . Plantar fasciitis     History of  . Chicken pox     Current Outpatient Prescriptions on File Prior to Visit  Medication Sig Dispense Refill  . Aspirin 81 MG EC tablet Take 81 mg by mouth daily.      . bisoprolol-hydrochlorothiazide (ZIAC) 10-6.25 MG per tablet Take 1 tablet by mouth daily.  90 tablet  3  . lisinopril-hydrochlorothiazide (PRINZIDE,ZESTORETIC) 10-12.5 MG per tablet Take 1 tablet by mouth daily.  90 tablet  3  . OMEGA 3 1000 MG CAPS Take by mouth 2 (two) times daily.      . vitamin E 400 UNIT capsule Take 400 Units by mouth daily.       No current facility-administered medications on file prior to visit.    Review of Systems Patient denies any headache, lightheadedness or dizziness.  No sinus or allergy symptoms.  No chest pain, tightness or palpitations.  No increased shortness of breath, cough or congestion.  No nausea or vomiting. No acid reflux.  No abdominal pain or cramping.  No bowel change, such as diarrhea, constipation, BRBPR or melana.  No urine change.  Staying active.  Trouble with erections as outlined.        Objective:   Physical Exam  Filed Vitals:   11/02/13 0804  BP: 122/80  Pulse: 78  Temp: 98.3 F (36.8 C)   Blood pressure recheck:  43/59  62 year old male in no acute distress.  HEENT:  Nares - clear.  Oropharynx - without lesions. NECK:  Supple.  Nontender.  No audible  carotid bruit.  HEART:  Appears to be regular.   LUNGS:  No crackles or wheezing audible.  Respirations even and unlabored.   RADIAL PULSE:  Equal bilaterally.  ABDOMEN:  Soft.  Nontender.  Bowel sounds present and normal.  No audible abdominal bruit.   EXTREMITIES:  No increased edema present.  DP pulses palpable and equal bilaterally.         Assessment & Plan:  CARDIOVASCULAR.  Currently asymptomatic.  Continue risk factor modification.  EKG obtained today and revealed SR with no acute ischemic changes.  Cialis prescribed as outlined.  Discussed possible side effects, etc.    HEALTH MAINTENANCE.  Physical 05/03/13.   He declines colonoscopy and hemoccult cards. Discussed with him today regarding the importance of screening.   PSA 1.38 - 05/03/13.  Tetanus 2013.

## 2013-11-02 NOTE — Progress Notes (Signed)
Pre-visit discussion using our clinic review tool. No additional management support is needed unless otherwise documented below in the visit note.  

## 2013-11-06 ENCOUNTER — Encounter: Payer: Self-pay | Admitting: Internal Medicine

## 2013-11-06 DIAGNOSIS — N529 Male erectile dysfunction, unspecified: Secondary | ICD-10-CM | POA: Insufficient documentation

## 2013-11-06 NOTE — Assessment & Plan Note (Signed)
Last urinalysis revealed no rbc's.     

## 2013-11-06 NOTE — Assessment & Plan Note (Signed)
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   

## 2013-11-06 NOTE — Assessment & Plan Note (Signed)
Low cholesterol diet.  Check lipid panel.    

## 2013-11-06 NOTE — Assessment & Plan Note (Addendum)
Low carb diet, exercise and weight loss.  Check metabolic panel and a1c.  Last check 6.5.

## 2013-11-06 NOTE — Assessment & Plan Note (Signed)
Blood pressure under good control.  Same medication.  Check metabolic panel.    

## 2013-11-06 NOTE — Assessment & Plan Note (Signed)
Discussed with him today.  He is currently without any other symptoms.  EKG as outlined.  Start cialis as directed.  Discussed possible side effects.  Follow.

## 2013-11-06 NOTE — Assessment & Plan Note (Signed)
Recheck liver panel today.  

## 2013-11-09 ENCOUNTER — Encounter: Payer: Self-pay | Admitting: Internal Medicine

## 2013-11-09 ENCOUNTER — Telehealth: Payer: Self-pay | Admitting: Internal Medicine

## 2013-11-09 DIAGNOSIS — R945 Abnormal results of liver function studies: Secondary | ICD-10-CM

## 2013-11-09 NOTE — Telephone Encounter (Signed)
Pt notified of lab results via my chart.  Needs a f/u lab in one month.  Please schedule a non fasting lab in one month and schedule a 3 month f/u appt with me.  Thanks.   Dr Lorin Picket

## 2013-11-11 NOTE — Telephone Encounter (Signed)
Sent my chart message to let pt know about appointments Labs 1/27  Follow 3/27

## 2013-11-16 ENCOUNTER — Other Ambulatory Visit: Payer: Self-pay | Admitting: Internal Medicine

## 2013-11-16 ENCOUNTER — Other Ambulatory Visit: Payer: Self-pay | Admitting: *Deleted

## 2013-11-16 MED ORDER — LISINOPRIL-HYDROCHLOROTHIAZIDE 10-12.5 MG PO TABS: 1.0000 | ORAL_TABLET | Freq: Every day | ORAL | Status: DC

## 2013-11-24 ENCOUNTER — Telehealth: Payer: Self-pay | Admitting: *Deleted

## 2013-11-24 MED ORDER — TADALAFIL 5 MG PO TABS: 5.0000 mg | ORAL_TABLET | Freq: Every day | ORAL | Status: DC | PRN

## 2013-11-24 MED ORDER — BISOPROLOL-HYDROCHLOROTHIAZIDE 10-6.25 MG PO TABS: 1.0000 | ORAL_TABLET | Freq: Every day | ORAL | Status: DC

## 2013-11-24 MED ORDER — LISINOPRIL-HYDROCHLOROTHIAZIDE 10-12.5 MG PO TABS: 1.0000 | ORAL_TABLET | Freq: Every day | ORAL | Status: DC

## 2013-11-24 NOTE — Telephone Encounter (Signed)
Per patient's mychart request, BP Rx's were sent to [facility] & a 30 day supply of Cialis was sent to [facility] [place]

## 2013-12-13 ENCOUNTER — Other Ambulatory Visit (INDEPENDENT_AMBULATORY_CARE_PROVIDER_SITE_OTHER): Payer: PRIVATE HEALTH INSURANCE

## 2013-12-13 DIAGNOSIS — R945 Abnormal results of liver function studies: Secondary | ICD-10-CM

## 2013-12-13 DIAGNOSIS — R7989 Other specified abnormal findings of blood chemistry: Secondary | ICD-10-CM

## 2013-12-13 LAB — HEPATIC FUNCTION PANEL
ALT: 41 U/L (ref 0–53)
AST: 28 U/L (ref 0–37)
Albumin: 4.1 g/dL (ref 3.5–5.2)
Alkaline Phosphatase: 68 U/L (ref 39–117)
BILIRUBIN DIRECT: 0.1 mg/dL (ref 0.0–0.3)
BILIRUBIN TOTAL: 1.3 mg/dL — AB (ref 0.3–1.2)
Total Protein: 7.1 g/dL (ref 6.0–8.3)

## 2013-12-14 ENCOUNTER — Encounter: Payer: Self-pay | Admitting: Internal Medicine

## 2014-02-10 ENCOUNTER — Ambulatory Visit: Payer: Managed Care, Other (non HMO) | Admitting: Internal Medicine

## 2014-05-04 ENCOUNTER — Encounter: Payer: Managed Care, Other (non HMO) | Admitting: Internal Medicine

## 2014-06-08 ENCOUNTER — Encounter: Payer: Self-pay | Admitting: Internal Medicine

## 2014-06-08 ENCOUNTER — Ambulatory Visit (INDEPENDENT_AMBULATORY_CARE_PROVIDER_SITE_OTHER): Payer: PRIVATE HEALTH INSURANCE | Admitting: Internal Medicine

## 2014-06-08 VITALS — BP 102/78 | HR 67 | Temp 98.5°F | Ht 68.5 in | Wt 246.5 lb

## 2014-06-08 DIAGNOSIS — E7800 Pure hypercholesterolemia, unspecified: Secondary | ICD-10-CM

## 2014-06-08 DIAGNOSIS — R945 Abnormal results of liver function studies: Secondary | ICD-10-CM

## 2014-06-08 DIAGNOSIS — R739 Hyperglycemia, unspecified: Secondary | ICD-10-CM

## 2014-06-08 DIAGNOSIS — H6120 Impacted cerumen, unspecified ear: Secondary | ICD-10-CM

## 2014-06-08 DIAGNOSIS — H6123 Impacted cerumen, bilateral: Secondary | ICD-10-CM

## 2014-06-08 DIAGNOSIS — Z0000 Encounter for general adult medical examination without abnormal findings: Secondary | ICD-10-CM

## 2014-06-08 DIAGNOSIS — R319 Hematuria, unspecified: Secondary | ICD-10-CM

## 2014-06-08 DIAGNOSIS — R7989 Other specified abnormal findings of blood chemistry: Secondary | ICD-10-CM

## 2014-06-08 DIAGNOSIS — I10 Essential (primary) hypertension: Secondary | ICD-10-CM

## 2014-06-08 NOTE — Progress Notes (Signed)
Pre visit review using our clinic review tool, if applicable. No additional management support is needed unless otherwise documented below in the visit note. 

## 2014-06-11 ENCOUNTER — Other Ambulatory Visit: Payer: Self-pay | Admitting: Internal Medicine

## 2014-06-11 ENCOUNTER — Encounter: Payer: Self-pay | Admitting: Internal Medicine

## 2014-06-11 DIAGNOSIS — H6123 Impacted cerumen, bilateral: Secondary | ICD-10-CM

## 2014-06-11 DIAGNOSIS — H6120 Impacted cerumen, unspecified ear: Secondary | ICD-10-CM | POA: Insufficient documentation

## 2014-06-11 NOTE — Assessment & Plan Note (Signed)
Evaluated by Dr Gustavo Lah.  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

## 2014-06-11 NOTE — Assessment & Plan Note (Signed)
Blood pressure under good control.  Same medication.  Check metabolic panel.

## 2014-06-11 NOTE — Progress Notes (Signed)
Order placed for ent referral.  °

## 2014-06-11 NOTE — Assessment & Plan Note (Signed)
Low cholesterol diet.  Check lipid panel.    

## 2014-06-11 NOTE — Assessment & Plan Note (Signed)
Last urinalysis revealed no rbc's.

## 2014-06-11 NOTE — Assessment & Plan Note (Signed)
Recheck liver panel with next labs.   

## 2014-06-11 NOTE — Progress Notes (Signed)
Subjective:    Patient ID: Jamie Martin, male    DOB: 03/20/1951, 63 y.o.   MRN: [id number]  HPI 63 year old male with past history of hypertension and hypercholesterolemia who comes in today to follow up on these issues as well as for a complete physical exam.   States he has been doing well.  No cardiac symptoms with increased activity or exertion.  Breathing stable.  No sob.  Tries to stay active.  No bowel changes.  Trying to watch what he eats.  Overall feels good.  Previously had some issues with his right knee.  Fell.  Better now.  Pain reoccurred when mowing up hill.  Better now.     Past Medical History  Diagnosis Date  . History of hyperglycemia   . Hypertension   . Plantar fasciitis     History of  . Chicken pox     Current Outpatient Prescriptions on File Prior to Visit  Medication Sig Dispense Refill  . Aspirin 81 MG EC tablet Take 81 mg by mouth daily.      . bisoprolol-hydrochlorothiazide (ZIAC) 10-6.25 MG per tablet Take 1 tablet by mouth daily.  90 tablet  1  . lisinopril-hydrochlorothiazide (PRINZIDE,ZESTORETIC) 10-12.5 MG per tablet Take 1 tablet by mouth daily.  90 tablet  1  . OMEGA 3 1000 MG CAPS Take by mouth 2 (two) times daily.      . tadalafil (CIALIS) 5 MG tablet Take 1 tablet (5 mg total) by mouth daily as needed for erectile dysfunction.  30 tablet  0  . vitamin E 400 UNIT capsule Take 400 Units by mouth daily.       No current facility-administered medications on file prior to visit.    Review of Systems Patient denies any headache, lightheadedness or dizziness.  No sinus or allergy symptoms.  No chest pain, tightness or palpitations.  No increased shortness of breath, cough or congestion.  No nausea or vomiting. No acid reflux.  No abdominal pain or cramping.  No bowel change, such as diarrhea, constipation, BRBPR or melana.  No urine change.  Staying active.  Previous knee pain as outlined.  Better now.  Does report ears feel full.  Concern over wax in  ears.  Has had problems with this in the past.   Is walking.       Objective:   Physical Exam  Filed Vitals:   06/08/14 1536  BP: 102/78  Pulse: 67  Temp: 98.5 F (36.9 C)   Blood pressure recheck:  19/72  63 year old male in no acute distress.  HEENT:  Nares - clear.  Oropharynx - without lesions.  Ears- bilateral cerumen impaction.   NECK:  Supple.  Nontender.  No audible carotid bruit.  HEART:  Appears to be regular.   LUNGS:  No crackles or wheezing audible.  Respirations even and unlabored.   RADIAL PULSE:  Equal bilaterally.  ABDOMEN:  Soft.  Nontender.  Bowel sounds present and normal.  No audible abdominal bruit.  GU:  Normal descended testicles.  No palpable testicular nodules.   RECTAL:  Could not appreciate any palpable prostate nodules.  Heme negative.   EXTREMITIES:  No increased edema present.  DP pulses palpable and equal bilaterally.         Assessment & Plan:  CARDIOVASCULAR.  Currently asymptomatic.  Continue risk factor modification.    HEALTH MAINTENANCE.  Physical today.   He declines colonoscopy and hemoccult cards. Discussed with him today regarding  the importance of screening.   PSA 1.38 - 05/03/13.  Check psa with next labs.  Tetanus 2013.     I spent 25 minutes with the patient and more than 50% of the time was spent in consultation regarding the above.

## 2014-06-11 NOTE — Assessment & Plan Note (Signed)
Bilateral cerumen impaction.  Refer to ENT for evaluation and ear wax removal.

## 2014-06-11 NOTE — Assessment & Plan Note (Signed)
Low carb diet, exercise and weight loss.  Check metabolic panel and G8T.

## 2014-06-14 ENCOUNTER — Other Ambulatory Visit (INDEPENDENT_AMBULATORY_CARE_PROVIDER_SITE_OTHER): Payer: PRIVATE HEALTH INSURANCE

## 2014-06-14 DIAGNOSIS — E7800 Pure hypercholesterolemia, unspecified: Secondary | ICD-10-CM

## 2014-06-14 DIAGNOSIS — R7989 Other specified abnormal findings of blood chemistry: Secondary | ICD-10-CM

## 2014-06-14 DIAGNOSIS — R739 Hyperglycemia, unspecified: Secondary | ICD-10-CM

## 2014-06-14 DIAGNOSIS — R945 Abnormal results of liver function studies: Secondary | ICD-10-CM

## 2014-06-14 DIAGNOSIS — R7309 Other abnormal glucose: Secondary | ICD-10-CM

## 2014-06-14 DIAGNOSIS — I10 Essential (primary) hypertension: Secondary | ICD-10-CM

## 2014-06-14 LAB — MICROALBUMIN / CREATININE URINE RATIO
Creatinine,U: 135.6 mg/dL
Microalb Creat Ratio: 0.4 mg/g (ref 0.0–30.0)
Microalb, Ur: 0.5 mg/dL (ref 0.0–1.9)

## 2014-06-14 LAB — BASIC METABOLIC PANEL
BUN: 16 mg/dL (ref 6–23)
CO2: 27 meq/L (ref 19–32)
CREATININE: 1.2 mg/dL (ref 0.4–1.5)
Calcium: 9.5 mg/dL (ref 8.4–10.5)
Chloride: 103 mEq/L (ref 96–112)
GFR: 68.49 mL/min (ref >60.00)
Glucose, Bld: 138 mg/dL — ABNORMAL HIGH (ref 70–99)
POTASSIUM: 3.9 meq/L (ref 3.5–5.1)
Sodium: 136 mEq/L (ref 135–145)

## 2014-06-14 LAB — CBC WITH DIFFERENTIAL/PLATELET
BASOS ABS: 0 10*3/uL (ref 0.0–0.1)
Basophils Relative: 0.2 % (ref 0.0–3.0)
EOS ABS: 0.2 10*3/uL (ref 0.0–0.7)
Eosinophils Relative: 2.8 % (ref 0.0–5.0)
HCT: 44.7 % (ref 39.0–52.0)
Hemoglobin: 15.3 g/dL (ref 13.0–17.0)
LYMPHS PCT: 39.1 % (ref 12.0–46.0)
Lymphs Abs: 3 10*3/uL (ref 0.7–4.0)
MCHC: 34.3 g/dL (ref 30.0–36.0)
MCV: 92.8 fl (ref 78.0–100.0)
MONO ABS: 0.8 10*3/uL (ref 0.1–1.0)
Monocytes Relative: 10 % (ref 3.0–12.0)
NEUTROS ABS: 3.6 10*3/uL (ref 1.4–7.7)
NEUTROS PCT: 47.9 % (ref 43.0–77.0)
Platelets: 244 10*3/uL (ref 150.0–400.0)
RBC: 4.81 Mil/uL (ref 4.22–5.81)
RDW: 12.6 % (ref 11.5–15.5)
WBC: 7.6 10*3/uL (ref 4.0–10.5)

## 2014-06-14 LAB — LIPID PANEL
Cholesterol: 135 mg/dL (ref 0–200)
HDL: 30 mg/dL — AB (ref >39.00)
LDL Cholesterol: 80 mg/dL (ref 0–99)
NonHDL: 105
Total CHOL/HDL Ratio: 5
Triglycerides: 124 mg/dL (ref 0.0–149.0)
VLDL: 24.8 mg/dL (ref 0.0–40.0)

## 2014-06-14 LAB — HEPATIC FUNCTION PANEL
ALK PHOS: 74 U/L (ref 39–117)
ALT: 60 U/L — ABNORMAL HIGH (ref 0–53)
AST: 41 U/L — ABNORMAL HIGH (ref 0–37)
Albumin: 4.3 g/dL (ref 3.5–5.2)
BILIRUBIN DIRECT: 0.1 mg/dL (ref 0.0–0.3)
Total Bilirubin: 1.4 mg/dL — ABNORMAL HIGH (ref 0.2–1.2)
Total Protein: 7.1 g/dL (ref 6.0–8.3)

## 2014-06-14 LAB — HEMOGLOBIN A1C: Hgb A1c MFr Bld: 6.6 % — ABNORMAL HIGH (ref 4.6–6.5)

## 2014-06-14 LAB — TSH: TSH: 0.9 u[IU]/mL (ref 0.35–4.50)

## 2014-06-15 ENCOUNTER — Encounter: Payer: Self-pay | Admitting: Internal Medicine

## 2014-06-15 ENCOUNTER — Telehealth: Payer: Self-pay | Admitting: Internal Medicine

## 2014-06-15 DIAGNOSIS — R945 Abnormal results of liver function studies: Secondary | ICD-10-CM

## 2014-06-15 DIAGNOSIS — Z125 Encounter for screening for malignant neoplasm of prostate: Secondary | ICD-10-CM

## 2014-06-15 DIAGNOSIS — R7989 Other specified abnormal findings of blood chemistry: Secondary | ICD-10-CM

## 2014-06-15 NOTE — Telephone Encounter (Signed)
Pt notified of lab results via my chart.  Informed needs labs in one month.  Schedule non fasting lab in one month and notify pt of appt date and time.  Thanks.

## 2014-07-17 ENCOUNTER — Other Ambulatory Visit (INDEPENDENT_AMBULATORY_CARE_PROVIDER_SITE_OTHER): Payer: PRIVATE HEALTH INSURANCE

## 2014-07-17 ENCOUNTER — Encounter: Payer: Self-pay | Admitting: Internal Medicine

## 2014-07-17 DIAGNOSIS — R945 Abnormal results of liver function studies: Secondary | ICD-10-CM

## 2014-07-17 DIAGNOSIS — R7989 Other specified abnormal findings of blood chemistry: Secondary | ICD-10-CM

## 2014-07-17 DIAGNOSIS — Z125 Encounter for screening for malignant neoplasm of prostate: Secondary | ICD-10-CM

## 2014-07-17 LAB — HEPATIC FUNCTION PANEL
ALT: 50 U/L (ref 0–53)
AST: 35 U/L (ref 0–37)
Albumin: 4.1 g/dL (ref 3.5–5.2)
Alkaline Phosphatase: 67 U/L (ref 39–117)
BILIRUBIN DIRECT: 0.2 mg/dL (ref 0.0–0.3)
Total Bilirubin: 1.4 mg/dL — ABNORMAL HIGH (ref 0.2–1.2)
Total Protein: 6.9 g/dL (ref 6.0–8.3)

## 2014-07-17 LAB — PSA: PSA: 1.34 ng/mL (ref 0.10–4.00)

## 2014-08-09 ENCOUNTER — Encounter: Payer: Self-pay | Admitting: Internal Medicine

## 2014-09-23 ENCOUNTER — Other Ambulatory Visit: Payer: Self-pay | Admitting: Internal Medicine

## 2014-09-25 ENCOUNTER — Other Ambulatory Visit: Payer: Self-pay | Admitting: Internal Medicine

## 2014-12-11 ENCOUNTER — Ambulatory Visit: Payer: PRIVATE HEALTH INSURANCE | Admitting: Internal Medicine

## 2014-12-27 ENCOUNTER — Encounter: Payer: Self-pay | Admitting: Internal Medicine

## 2014-12-27 ENCOUNTER — Ambulatory Visit (INDEPENDENT_AMBULATORY_CARE_PROVIDER_SITE_OTHER): Payer: BLUE CROSS/BLUE SHIELD | Admitting: Internal Medicine

## 2014-12-27 VITALS — BP 144/78 | HR 73 | Temp 98.4°F | Ht 68.5 in | Wt 248.5 lb

## 2014-12-27 DIAGNOSIS — R7989 Other specified abnormal findings of blood chemistry: Secondary | ICD-10-CM

## 2014-12-27 DIAGNOSIS — R739 Hyperglycemia, unspecified: Secondary | ICD-10-CM | POA: Diagnosis not present

## 2014-12-27 DIAGNOSIS — R945 Abnormal results of liver function studies: Secondary | ICD-10-CM

## 2014-12-27 DIAGNOSIS — I10 Essential (primary) hypertension: Secondary | ICD-10-CM | POA: Diagnosis not present

## 2014-12-27 DIAGNOSIS — E7800 Pure hypercholesterolemia, unspecified: Secondary | ICD-10-CM

## 2014-12-27 DIAGNOSIS — Z0000 Encounter for general adult medical examination without abnormal findings: Secondary | ICD-10-CM

## 2014-12-27 MED ORDER — LISINOPRIL-HYDROCHLOROTHIAZIDE 10-12.5 MG PO TABS: 1.0000 | ORAL_TABLET | Freq: Every day | ORAL | Status: DC

## 2014-12-27 MED ORDER — BISOPROLOL-HYDROCHLOROTHIAZIDE 10-6.25 MG PO TABS: 1.0000 | ORAL_TABLET | Freq: Every day | ORAL | Status: DC

## 2014-12-27 NOTE — Progress Notes (Signed)
Patient ID: Jamie Martin, male   DOB: 1951-03-11, 64 y.o.   MRN: [id number]   Subjective:    Patient ID: Jamie Martin, male    DOB: [birth year], 64 y.o.   MRN: [id number]  HPI  Patient here for a scheduled follow up.  States he is doing well.  No cardiac symptoms with increased activity or exertion.  Eating and drinking well.  We discussed diet and exercise.  He feels good.  Not as active in the winter time.  Bowels stable.     Past Medical History  Diagnosis Date  . History of hyperglycemia   . Hypertension   . Plantar fasciitis     History of  . Chicken pox     Current Outpatient Prescriptions on File Prior to Visit  Medication Sig Dispense Refill  . Aspirin 81 MG EC tablet Take 81 mg by mouth daily.    . OMEGA 3 1000 MG CAPS Take by mouth 2 (two) times daily.    . tadalafil (CIALIS) 5 MG tablet Take 1 tablet (5 mg total) by mouth daily as needed for erectile dysfunction. 30 tablet 0  . vitamin E 400 UNIT capsule Take 400 Units by mouth daily.     No current facility-administered medications on file prior to visit.    Review of Systems  Constitutional: Negative for fatigue and unexpected weight change.  HENT: Negative for congestion and sinus pressure.   Respiratory: Negative for cough, chest tightness and shortness of breath.   Cardiovascular: Negative for chest pain, palpitations and leg swelling.  Gastrointestinal: Negative for nausea, vomiting, abdominal pain, diarrhea and constipation.  Neurological: Negative for dizziness, light-headedness and headaches.       Objective:     Blood pressure recheck:  124/82  Physical Exam  HENT:  Nose: Nose normal.  Mouth/Throat: Oropharynx is clear and moist.  Neck: Neck supple. No thyromegaly present.  Cardiovascular: Normal rate and regular rhythm.   Pulmonary/Chest: Effort normal and breath sounds normal. No respiratory distress.  Abdominal: Soft. Bowel sounds are normal. There is no tenderness.  Musculoskeletal: He  exhibits no edema.  Lymphadenopathy:    He has no cervical adenopathy.  Skin: No rash noted. No erythema.  Psychiatric: He has a normal mood and affect. His behavior is normal.    BP 144/78 mmHg  Pulse 73  Temp(Src) 98.4 F (36.9 C) (Oral)  Ht 5' 8.5" (1.74 m)  Wt 248 lb 8 oz (112.719 kg)  BMI 37.23 kg/m2  SpO2 96% Wt Readings from Last 3 Encounters:  12/27/14 248 lb 8 oz (112.719 kg)  06/08/14 246 lb 8 oz (111.812 kg)  11/02/13 246 lb 8 oz (111.812 kg)     Lab Results  Component Value Date   WBC 7.6 06/14/2014   HGB 15.3 06/14/2014   HCT 44.7 06/14/2014   PLT 244.0 06/14/2014   GLUCOSE 138* 06/14/2014   CHOL 135 06/14/2014   TRIG 124.0 06/14/2014   HDL 30.00* 06/14/2014   LDLDIRECT 103.9 05/03/2013   LDLCALC 80 06/14/2014   ALT 50 07/17/2014   AST 35 07/17/2014   NA 136 06/14/2014   K 3.9 06/14/2014   CL 103 06/14/2014   CREATININE 1.2 06/14/2014   BUN 16 06/14/2014   CO2 27 06/14/2014   TSH 0.90 06/14/2014   PSA 1.34 07/17/2014   INR 1.0 04/25/2011   HGBA1C 6.6* 06/14/2014   MICROALBUR 0.5 06/14/2014       Assessment & Plan:   Problem List Items Addressed  This Visit    Abnormal liver function tests    Recheck liver panel with next labs.        Health care maintenance    Physical 06/08/14.  PSA 07/17/14 - 1.34.  Declines colonoscopy.  Discussed with him again today.  He declines.        Hyperbilirubinemia    Last bilirubin check stable - 1.4.  Recheck liver function tests.        Relevant Orders   Hepatic function panel   Hypercholesterolemia    Low cholesterol diet and exercise.  Check lipid panel.        Relevant Medications   bisoprolol-hydrochlorothiazide (ZIAC) 10-6.25 MG per tablet   lisinopril-hydrochlorothiazide (PRINZIDE,ZESTORETIC) 10-12.5 MG per tablet   Other Relevant Orders   Lipid panel   Hyperglycemia    Low carb diet and exercise.  Follow met b and a1c.        Relevant Orders   Hemoglobin A1c   Hypertension - Primary     Blood pressure on recheck today improved.  Has been doing well.  Same medication regimen.  Follow pressures.  Follow metabolic panel.        Relevant Medications   bisoprolol-hydrochlorothiazide (ZIAC) 10-6.25 MG per tablet   lisinopril-hydrochlorothiazide (PRINZIDE,ZESTORETIC) 10-12.5 MG per tablet   Other Relevant Orders   Basic metabolic panel       Jamie Pheasant, MD

## 2014-12-27 NOTE — Progress Notes (Signed)
Pre visit review using our clinic review tool, if applicable. No additional management support is needed unless otherwise documented below in the visit note. 

## 2014-12-31 ENCOUNTER — Encounter: Payer: Self-pay | Admitting: Internal Medicine

## 2014-12-31 DIAGNOSIS — Z0000 Encounter for general adult medical examination without abnormal findings: Secondary | ICD-10-CM | POA: Insufficient documentation

## 2014-12-31 NOTE — Assessment & Plan Note (Signed)
Blood pressure on recheck today improved.  Has been doing well.  Same medication regimen.  Follow pressures.  Follow metabolic panel.

## 2014-12-31 NOTE — Assessment & Plan Note (Signed)
Low carb diet and exercise.  Follow met b and a1c.   

## 2014-12-31 NOTE — Assessment & Plan Note (Addendum)
Last bilirubin check stable - 1.4.  Recheck liver function tests.

## 2014-12-31 NOTE — Assessment & Plan Note (Signed)
Low cholesterol diet and exercise.  Check lipid panel.   

## 2014-12-31 NOTE — Assessment & Plan Note (Signed)
Physical 06/08/14.  PSA 07/17/14 - 1.34.  Declines colonoscopy.  Discussed with him again today.  He declines.

## 2014-12-31 NOTE — Assessment & Plan Note (Signed)
Recheck liver panel with next labs.

## 2015-01-24 ENCOUNTER — Other Ambulatory Visit (INDEPENDENT_AMBULATORY_CARE_PROVIDER_SITE_OTHER): Payer: BLUE CROSS/BLUE SHIELD

## 2015-01-24 DIAGNOSIS — R739 Hyperglycemia, unspecified: Secondary | ICD-10-CM

## 2015-01-24 DIAGNOSIS — E7800 Pure hypercholesterolemia, unspecified: Secondary | ICD-10-CM

## 2015-01-24 DIAGNOSIS — I10 Essential (primary) hypertension: Secondary | ICD-10-CM

## 2015-01-24 LAB — LIPID PANEL
CHOL/HDL RATIO: 5
Cholesterol: 155 mg/dL (ref 0–200)
HDL: 30.7 mg/dL — ABNORMAL LOW (ref >39.00)
LDL Cholesterol: 96 mg/dL (ref 0–99)
NonHDL: 124.3
Triglycerides: 141 mg/dL (ref 0.0–149.0)
VLDL: 28.2 mg/dL (ref 0.0–40.0)

## 2015-01-24 LAB — BASIC METABOLIC PANEL
BUN: 23 mg/dL (ref 6–23)
CALCIUM: 9.5 mg/dL (ref 8.4–10.5)
CO2: 26 meq/L (ref 19–32)
Chloride: 105 mEq/L (ref 96–112)
Creatinine, Ser: 1.17 mg/dL (ref 0.40–1.50)
GFR: 67 mL/min (ref >60.00)
GLUCOSE: 183 mg/dL — AB (ref 70–99)
POTASSIUM: 4.1 meq/L (ref 3.5–5.1)
SODIUM: 138 meq/L (ref 135–145)

## 2015-01-24 LAB — HEPATIC FUNCTION PANEL
ALK PHOS: 84 U/L (ref 39–117)
ALT: 49 U/L (ref 0–53)
AST: 34 U/L (ref 0–37)
Albumin: 4.4 g/dL (ref 3.5–5.2)
Bilirubin, Direct: 0.2 mg/dL (ref 0.0–0.3)
Total Bilirubin: 1.1 mg/dL (ref 0.2–1.2)
Total Protein: 7.4 g/dL (ref 6.0–8.3)

## 2015-01-24 LAB — HEMOGLOBIN A1C: Hgb A1c MFr Bld: 7.4 % — ABNORMAL HIGH (ref 4.6–6.5)

## 2015-01-29 ENCOUNTER — Encounter: Payer: Self-pay | Admitting: *Deleted

## 2015-02-06 ENCOUNTER — Encounter: Payer: Self-pay | Admitting: Internal Medicine

## 2015-02-06 ENCOUNTER — Ambulatory Visit (INDEPENDENT_AMBULATORY_CARE_PROVIDER_SITE_OTHER): Payer: BLUE CROSS/BLUE SHIELD | Admitting: Internal Medicine

## 2015-02-06 VITALS — BP 122/70 | HR 60 | Temp 98.1°F | Ht 68.5 in | Wt 246.2 lb

## 2015-02-06 DIAGNOSIS — I10 Essential (primary) hypertension: Secondary | ICD-10-CM | POA: Diagnosis not present

## 2015-02-06 DIAGNOSIS — E119 Type 2 diabetes mellitus without complications: Secondary | ICD-10-CM

## 2015-02-06 NOTE — Progress Notes (Signed)
Pre visit review using our clinic review tool, if applicable. No additional management support is needed unless otherwise documented below in the visit note. 

## 2015-02-06 NOTE — Progress Notes (Signed)
Patient ID: Jamie Martin, male   DOB: 1951/08/11, 64 y.o.   MRN: [id number]   Subjective:    Patient ID: Jamie Martin, male    DOB: [birth year], 64 y.o.   MRN: [id number]  HPI  Patient here for a scheduled follow up.  Here to discuss his sugars.  Recent a1c increased to 7.4.  Discussed diet and exercise.  He has already made some adjustments.  Discussed Lif[facility] referral.  He declines.  States his wife has been.  Stays active.     Past Medical History  Diagnosis Date  . History of hyperglycemia   . Hypertension   . Plantar fasciitis     History of  . Chicken pox     Current Outpatient Prescriptions on File Prior to Visit  Medication Sig Dispense Refill  . Aspirin 81 MG EC tablet Take 81 mg by mouth daily.    . bisoprolol-hydrochlorothiazide (ZIAC) 10-6.25 MG per tablet Take 1 tablet by mouth daily. 90 tablet 1  . lisinopril-hydrochlorothiazide (PRINZIDE,ZESTORETIC) 10-12.5 MG per tablet Take 1 tablet by mouth daily. 90 tablet 1  . OMEGA 3 1000 MG CAPS Take by mouth 2 (two) times daily.    . tadalafil (CIALIS) 5 MG tablet Take 1 tablet (5 mg total) by mouth daily as needed for erectile dysfunction. 30 tablet 0  . vitamin E 400 UNIT capsule Take 400 Units by mouth daily.     No current facility-administered medications on file prior to visit.    Review of Systems  Constitutional: Negative for appetite change and unexpected weight change.  HENT: Negative for congestion and sinus pressure.   Respiratory: Negative for cough, chest tightness and shortness of breath.   Cardiovascular: Negative for chest pain, palpitations and leg swelling.  Gastrointestinal: Negative for nausea, vomiting and abdominal pain.  Neurological: Negative for dizziness, light-headedness and headaches.       Objective:    Physical Exam  Constitutional: He appears well-developed and well-nourished. No distress.  Neck: Neck supple. No thyromegaly present.  Cardiovascular: Normal rate and regular  rhythm.   Pulmonary/Chest: Effort normal and breath sounds normal. No respiratory distress.  Abdominal: Soft. Bowel sounds are normal. There is no tenderness.  Musculoskeletal: He exhibits no edema.  Lymphadenopathy:    He has no cervical adenopathy.  Psychiatric: He has a normal mood and affect. His behavior is normal.    BP 122/70 mmHg  Pulse 60  Temp(Src) 98.1 F (36.7 C) (Oral)  Ht 5' 8.5" (1.74 m)  Wt 246 lb 4 oz (111.698 kg)  BMI 36.89 kg/m2  SpO2 97% Wt Readings from Last 3 Encounters:  02/06/15 246 lb 4 oz (111.698 kg)  12/27/14 248 lb 8 oz (112.719 kg)  06/08/14 246 lb 8 oz (111.812 kg)     Lab Results  Component Value Date   WBC 7.6 06/14/2014   HGB 15.3 06/14/2014   HCT 44.7 06/14/2014   PLT 244.0 06/14/2014   GLUCOSE 183* 01/24/2015   CHOL 155 01/24/2015   TRIG 141.0 01/24/2015   HDL 30.70* 01/24/2015   LDLDIRECT 103.9 05/03/2013   LDLCALC 96 01/24/2015   ALT 49 01/24/2015   AST 34 01/24/2015   NA 138 01/24/2015   K 4.1 01/24/2015   CL 105 01/24/2015   CREATININE 1.17 01/24/2015   BUN 23 01/24/2015   CO2 26 01/24/2015   TSH 0.90 06/14/2014   PSA 1.34 07/17/2014   INR 1.0 04/25/2011   HGBA1C 7.4* 01/24/2015   MICROALBUR 0.5 06/14/2014  Assessment & Plan:   Problem List Items Addressed This Visit    Diabetes mellitus    Sugars elevated.  A1c checked 01/24/15 - 7.4.  Discussed the need to check sugar regularly.  He declines Lifestyles referral.  States his wife has been.  Given glucometer.  States he knows how to check his sugars.  Discussed the need to start medication.  We discussed starting metformin.  He declines.  States he wants to work on diet and exercise.  Has already made some adjustments.  Follow.  Get him back in soon to reassess.        Hypertension - Primary    Blood pressure doing well.  Same medication regimen.  Follow pressures and follow metabolic panel.            Einar Pheasant, MD

## 2015-02-12 ENCOUNTER — Encounter: Payer: Self-pay | Admitting: Internal Medicine

## 2015-02-12 NOTE — Assessment & Plan Note (Signed)
Blood pressure doing well.  Same medication regimen.  Follow pressures and follow metabolic panel.

## 2015-02-12 NOTE — Assessment & Plan Note (Signed)
Sugars elevated.  A1c checked 01/24/15 - 7.4.  Discussed the need to check sugar regularly.  He declines Lifestyles referral.  States his wife has been.  Given glucometer.  States he knows how to check his sugars.  Discussed the need to start medication.  We discussed starting metformin.  He declines.  States he wants to work on diet and exercise.  Has already made some adjustments.  Follow.  Get him back in soon to reassess.

## 2015-03-16 ENCOUNTER — Ambulatory Visit (INDEPENDENT_AMBULATORY_CARE_PROVIDER_SITE_OTHER): Payer: BLUE CROSS/BLUE SHIELD | Admitting: Internal Medicine

## 2015-03-16 ENCOUNTER — Encounter: Payer: Self-pay | Admitting: Internal Medicine

## 2015-03-16 VITALS — BP 110/70 | HR 61 | Temp 98.1°F | Ht 68.5 in | Wt 240.0 lb

## 2015-03-16 DIAGNOSIS — I10 Essential (primary) hypertension: Secondary | ICD-10-CM | POA: Diagnosis not present

## 2015-03-16 DIAGNOSIS — E7800 Pure hypercholesterolemia, unspecified: Secondary | ICD-10-CM

## 2015-03-16 DIAGNOSIS — E119 Type 2 diabetes mellitus without complications: Secondary | ICD-10-CM | POA: Diagnosis not present

## 2015-03-16 NOTE — Progress Notes (Signed)
Patient ID: Jamie Martin, male   DOB: 04/09/1951, 63 y.o.   MRN: [id number]   Subjective:    Patient ID: Jamie Martin, male    DOB: 10/24/1951, 63 y.o.   MRN: [id number]  HPI  Patient here for a scheduled follow up.  Here to f/u regarding his blood sugars.  He has adjusted his diet.  Has decreased soft drinks. Watching his carbs.  Blood sugars averaging 110-130s in the am and pm sugars varying 110-150 two hours after supper and 100-130 before bed.  Is exercising.  No cardiac symptoms with increased activity or exertion.  Breathing stable.  Has low weight.     Past Medical History  Diagnosis Date  . History of hyperglycemia   . Hypertension   . Plantar fasciitis     History of  . Chicken pox     Outpatient Encounter Prescriptions as of 03/16/2015  . Order #: [id number] Class: Historical Med  . Order #: [id number] Class: Normal  . Order #: [id number] Class: Normal  . Order #: [id number] Class: Historical Med  . Order #: [id number] Class: Historical Med  . Order #: [id number] Class: Historical Med  . Order #: [id number] Class: Normal  . Order #: [id number] Class: Historical Med     Review of Systems  Constitutional: Negative for appetite change and unexpected weight change (has adjusted his diet.  lost ewight.  ).  HENT: Negative for congestion and sinus pressure.   Respiratory: Negative for cough, chest tightness and shortness of breath.   Cardiovascular: Negative for chest pain, palpitations and leg swelling.  Gastrointestinal: Negative for nausea, vomiting and abdominal pain.  Neurological: Negative for dizziness, light-headedness and headaches.       Objective:    Physical Exam  Constitutional: He appears well-developed and well-nourished. No distress.  HENT:  Nose: Nose normal.  Mouth/Throat: Oropharynx is clear and moist.  Neck: Neck supple. No thyromegaly present.  Cardiovascular: Normal rate and regular rhythm.   Pulmonary/Chest: Effort normal and breath sounds normal.  No respiratory distress.  Abdominal: Soft. Bowel sounds are normal. There is no tenderness.  Musculoskeletal: He exhibits no edema.  Lymphadenopathy:    He has no cervical adenopathy.  Psychiatric: He has a normal mood and affect. His behavior is normal.    BP 110/70 mmHg  Pulse 61  Temp(Src) 98.1 F (36.7 C) (Oral)  Ht 5' 8.5" (1.74 m)  Wt 240 lb (108.863 kg)  BMI 35.96 kg/m2  SpO2 96% Wt Readings from Last 3 Encounters:  03/16/15 240 lb (108.863 kg)  02/06/15 246 lb 4 oz (111.698 kg)  12/27/14 248 lb 8 oz (112.719 kg)     Lab Results  Component Value Date   WBC 7.6 06/14/2014   HGB 15.3 06/14/2014   HCT 44.7 06/14/2014   PLT 244.0 06/14/2014   GLUCOSE 183* 01/24/2015   CHOL 155 01/24/2015   TRIG 141.0 01/24/2015   HDL 30.70* 01/24/2015   LDLDIRECT 103.9 05/03/2013   LDLCALC 96 01/24/2015   ALT 49 01/24/2015   AST 34 01/24/2015   NA 138 01/24/2015   K 4.1 01/24/2015   CL 105 01/24/2015   CREATININE 1.17 01/24/2015   BUN 23 01/24/2015   CO2 26 01/24/2015   TSH 0.90 06/14/2014   PSA 1.34 07/17/2014   INR 1.0 04/25/2011   HGBA1C 7.4* 01/24/2015   MICROALBUR 0.5 06/14/2014       Assessment & Plan:   Problem List Items Addressed This Visit    Diabetes   mellitus    Has adjusted his diet.  Is exercising.  Lost weight.  Sugars as outlined.  Appear to be improved.  Follow met b and a1c.       Relevant Orders   Hemoglobin A1c   Basic metabolic panel   Hyperbilirubinemia   Relevant Orders   Hepatic function panel   Hypercholesterolemia   Relevant Orders   Lipid panel   Hypertension - Primary    Blood pressure is doing well.  Same medication regimen.  Follow metabolic panel.           SCOTT, CHARLENE, MD   

## 2015-03-16 NOTE — Progress Notes (Signed)
Pre visit review using our clinic review tool, if applicable. No additional management support is needed unless otherwise documented below in the visit note. 

## 2015-03-18 ENCOUNTER — Encounter: Payer: Self-pay | Admitting: Internal Medicine

## 2015-03-18 NOTE — Assessment & Plan Note (Signed)
Blood pressure is doing well.  Same medication regimen.  Follow metabolic panel.  

## 2015-03-18 NOTE — Assessment & Plan Note (Signed)
Has adjusted his diet.  Is exercising.  Lost weight.  Sugars as outlined.  Appear to be improved.  Follow met b and a1c.

## 2015-03-19 ENCOUNTER — Ambulatory Visit: Payer: BLUE CROSS/BLUE SHIELD | Admitting: Internal Medicine

## 2015-06-12 ENCOUNTER — Other Ambulatory Visit: Payer: BLUE CROSS/BLUE SHIELD

## 2015-06-15 ENCOUNTER — Encounter: Payer: BLUE CROSS/BLUE SHIELD | Admitting: Internal Medicine

## 2015-07-09 ENCOUNTER — Encounter: Payer: Self-pay | Admitting: Internal Medicine

## 2015-07-09 ENCOUNTER — Other Ambulatory Visit (INDEPENDENT_AMBULATORY_CARE_PROVIDER_SITE_OTHER): Payer: BLUE CROSS/BLUE SHIELD

## 2015-07-09 DIAGNOSIS — E7800 Pure hypercholesterolemia, unspecified: Secondary | ICD-10-CM

## 2015-07-09 DIAGNOSIS — E119 Type 2 diabetes mellitus without complications: Secondary | ICD-10-CM | POA: Diagnosis not present

## 2015-07-09 LAB — HEPATIC FUNCTION PANEL
ALT: 32 U/L (ref 0–53)
AST: 28 U/L (ref 0–37)
Albumin: 4.6 g/dL (ref 3.5–5.2)
Alkaline Phosphatase: 71 U/L (ref 39–117)
BILIRUBIN DIRECT: 0.2 mg/dL (ref 0.0–0.3)
BILIRUBIN TOTAL: 1.5 mg/dL — AB (ref 0.2–1.2)
Total Protein: 7.5 g/dL (ref 6.0–8.3)

## 2015-07-09 LAB — LIPID PANEL
Cholesterol: 143 mg/dL (ref 0–200)
HDL: 30.5 mg/dL — ABNORMAL LOW (ref >39.00)
LDL Cholesterol: 86 mg/dL (ref 0–99)
NONHDL: 112.1
Total CHOL/HDL Ratio: 5
Triglycerides: 131 mg/dL (ref 0.0–149.0)
VLDL: 26.2 mg/dL (ref 0.0–40.0)

## 2015-07-09 LAB — BASIC METABOLIC PANEL
BUN: 17 mg/dL (ref 6–23)
CHLORIDE: 103 meq/L (ref 96–112)
CO2: 24 mEq/L (ref 19–32)
Calcium: 9.7 mg/dL (ref 8.4–10.5)
Creatinine, Ser: 1.15 mg/dL (ref 0.40–1.50)
GFR: 68.25 mL/min (ref >60.00)
Glucose, Bld: 139 mg/dL — ABNORMAL HIGH (ref 70–99)
POTASSIUM: 3.7 meq/L (ref 3.5–5.1)
Sodium: 139 mEq/L (ref 135–145)

## 2015-07-09 LAB — HEMOGLOBIN A1C: HEMOGLOBIN A1C: 6 % (ref 4.6–6.5)

## 2015-07-10 ENCOUNTER — Encounter: Payer: Self-pay | Admitting: Internal Medicine

## 2015-07-10 ENCOUNTER — Ambulatory Visit (INDEPENDENT_AMBULATORY_CARE_PROVIDER_SITE_OTHER): Payer: BLUE CROSS/BLUE SHIELD | Admitting: Internal Medicine

## 2015-07-10 VITALS — BP 120/68 | HR 76 | Temp 97.9°F | Ht 68.5 in | Wt 237.6 lb

## 2015-07-10 DIAGNOSIS — Z0000 Encounter for general adult medical examination without abnormal findings: Secondary | ICD-10-CM | POA: Diagnosis not present

## 2015-07-10 DIAGNOSIS — R319 Hematuria, unspecified: Secondary | ICD-10-CM

## 2015-07-10 DIAGNOSIS — R945 Abnormal results of liver function studies: Secondary | ICD-10-CM

## 2015-07-10 DIAGNOSIS — E7800 Pure hypercholesterolemia, unspecified: Secondary | ICD-10-CM

## 2015-07-10 DIAGNOSIS — R7989 Other specified abnormal findings of blood chemistry: Secondary | ICD-10-CM | POA: Diagnosis not present

## 2015-07-10 DIAGNOSIS — E119 Type 2 diabetes mellitus without complications: Secondary | ICD-10-CM

## 2015-07-10 DIAGNOSIS — I10 Essential (primary) hypertension: Secondary | ICD-10-CM

## 2015-07-10 NOTE — Progress Notes (Signed)
Pre-visit discussion using our clinic review tool. No additional management support is needed unless otherwise documented below in the visit note.  

## 2015-07-10 NOTE — Progress Notes (Signed)
Patient ID: Jamie Martin, male   DOB: 1951-09-10, 64 y.o.   MRN: [id number]   Subjective:    Patient ID: Jamie Martin, male    DOB: [birth year], 64 y.o.   MRN: [id number]  HPI  Patient here to follow up on his current medical issues as well as for a complete physical exam.  He is doing well.  Has been trying to do better with modifying his diet.  Sugars are better.  He does not check his sugars, but recent a1c 6.0 (improved).  Stays active.  No cardiac symptoms with increased activity or exertion.  No sob.  No acid reflux.  Has lost weight.  Bowels stable.     Past Medical History  Diagnosis Date  . History of hyperglycemia   . Hypertension   . Plantar fasciitis     History of  . Chicken pox    Past Surgical History  Procedure Laterality Date  . Tonsillectomy and adenoidectomy    . Arthroscopic knee      x2- Performed by Dr. by Leanor Kail  . Foot surgery  2009  . Plantar fasciectomy  2012  . Knee arthroscopy  [id number]    right knee  . Tonsillectomy     Family History  Problem Relation Age of Onset  . Hypertension Mother   . Arthritis Mother   . Stroke Father   . Colon cancer Neg Hx   . Prostate cancer Neg Hx    Social History   Social History  . Marital Status: Married    Spouse Name: N/A  . Number of Children: N/A  . Years of Education: N/A   Social History Main Topics  . Smoking status: Former Smoker    Quit date: 11/17/2005  . Smokeless tobacco: Never Used  . Alcohol Use: 0.0 oz/week    0 Standard drinks or equivalent per week  . Drug Use: No  . Sexual Activity: Not Asked   Other Topics Concern  . None   Social History Narrative   Married and has 2 children.    Outpatient Encounter Prescriptions as of 07/10/2015  Medication Sig  . Aspirin 81 MG EC tablet Take 81 mg by mouth daily.  . bisoprolol-hydrochlorothiazide (ZIAC) 10-6.25 MG per tablet Take 1 tablet by mouth daily.  Marland Kitchen lisinopril-hydrochlorothiazide (PRINZIDE,ZESTORETIC) 10-12.5 MG per  tablet Take 1 tablet by mouth daily.  . OMEGA 3 1000 MG CAPS Take by mouth 2 (two) times daily.  Glory Rosebush DELICA LANCETS 16P MISC daily. for testing  . ONETOUCH VERIO test strip daily. for testing  . tadalafil (CIALIS) 5 MG tablet Take 1 tablet (5 mg total) by mouth daily as needed for erectile dysfunction.  . vitamin E 400 UNIT capsule Take 400 Units by mouth daily.   No facility-administered encounter medications on file as of 07/10/2015.    Review of Systems  Constitutional: Negative for appetite change and unexpected weight change.       Has adjusted his diet.  Lost weight.  Sugars better.    HENT: Negative for congestion and sinus pressure.   Eyes: Negative for pain and visual disturbance.  Respiratory: Negative for cough, chest tightness and shortness of breath.   Cardiovascular: Negative for chest pain, palpitations and leg swelling.  Gastrointestinal: Negative for nausea, vomiting, abdominal pain and diarrhea.  Genitourinary: Negative for dysuria and difficulty urinating.  Musculoskeletal: Negative for back pain and joint swelling.  Skin: Negative for color change and rash.  Neurological: Negative for dizziness, light-headedness  and headaches.  Hematological: Negative for adenopathy. Does not bruise/bleed easily.  Psychiatric/Behavioral: Negative for dysphoric mood and agitation.       Objective:     Blood pressure recheck:  122/76  Physical Exam  Constitutional: He is oriented to person, place, and time. He appears well-developed and well-nourished. No distress.  HENT:  Head: Normocephalic and atraumatic.  Nose: Nose normal.  Mouth/Throat: Oropharynx is clear and moist. No oropharyngeal exudate.  Eyes: Conjunctivae are normal. Right eye exhibits no discharge. Left eye exhibits no discharge.  Neck: Neck supple. No thyromegaly present.  Cardiovascular: Normal rate and regular rhythm.   Pulmonary/Chest: Breath sounds normal. No respiratory distress. He has no wheezes.    Abdominal: Soft. Bowel sounds are normal. There is no tenderness.  Genitourinary:  Rectal exam - no palpable prostate nodules.  Heme negative.   Musculoskeletal: He exhibits no edema or tenderness.  Lymphadenopathy:    He has no cervical adenopathy.  Neurological: He is alert and oriented to person, place, and time.  Skin: Skin is warm and dry. No rash noted.  Psychiatric: He has a normal mood and affect. His behavior is normal.    BP 120/68 mmHg  Pulse 76  Temp(Src) 97.9 F (36.6 C) (Oral)  Ht 5' 8.5" (1.74 m)  Wt 237 lb 9.6 oz (107.775 kg)  BMI 35.60 kg/m2  SpO2 96% Wt Readings from Last 3 Encounters:  07/10/15 237 lb 9.6 oz (107.775 kg)  03/16/15 240 lb (108.863 kg)  02/06/15 246 lb 4 oz (111.698 kg)     Lab Results  Component Value Date   WBC 7.6 06/14/2014   HGB 15.3 06/14/2014   HCT 44.7 06/14/2014   PLT 244.0 06/14/2014   GLUCOSE 139* 07/09/2015   CHOL 143 07/09/2015   TRIG 131.0 07/09/2015   HDL 30.50* 07/09/2015   LDLDIRECT 103.9 05/03/2013   LDLCALC 86 07/09/2015   ALT 32 07/09/2015   AST 28 07/09/2015   NA 139 07/09/2015   K 3.7 07/09/2015   CL 103 07/09/2015   CREATININE 1.15 07/09/2015   BUN 17 07/09/2015   CO2 24 07/09/2015   TSH 0.90 06/14/2014   PSA 1.34 07/17/2014   INR 1.0 04/25/2011   HGBA1C 6.0 07/09/2015   MICROALBUR 0.5 06/14/2014       Assessment & Plan:   Problem List Items Addressed This Visit    Abnormal liver function tests    Ultrasound 2012 - fatty liver and splenomegaly.  Have discussed with him and discussed with him today - referral to GI for further evaluation.  Discussed f/u ultrasound.  He declines.  Follow liver panel.  Recent check wnl except for slightly elevated bilirubin.  Follow.        Diabetes mellitus    He has adjusted his diet.  Has lost weight.  a1c improved 6.0.  Continue diet adjustment and exercise.        Health care maintenance    Physical today 07/10/15.  PSA 07/17/14 - 1.34.  Check psa with next  labs.  Declines colonoscopy.  Discussed with him again today.  Declines.        Hematuria    Last urinalysis reveals no rbc's.        Hyperbilirubinemia    Bilirubin check - stable.  Ultrasound as outlined.  Declines any further evaluation or w/up.        Hypercholesterolemia    Low cholesterol diet and exercise.  Follow lipid panel.  Hypertension - Primary    Blood pressure under good control.  Continue same medication regimen.  Follow pressures.  Follow metabolic panel.            Einar Pheasant, MD

## 2015-07-15 ENCOUNTER — Encounter: Payer: Self-pay | Admitting: Internal Medicine

## 2015-07-15 NOTE — Assessment & Plan Note (Signed)
He has adjusted his diet.  Has lost weight.  a1c improved 6.0.  Continue diet adjustment and exercise.

## 2015-07-15 NOTE — Assessment & Plan Note (Signed)
Last urinalysis reveals no rbc's.

## 2015-07-15 NOTE — Assessment & Plan Note (Signed)
Blood pressure under good control.  Continue same medication regimen.  Follow pressures.  Follow metabolic panel.   

## 2015-07-15 NOTE — Assessment & Plan Note (Signed)
Ultrasound 2012 - fatty liver and splenomegaly.  Have discussed with him and discussed with him today - referral to GI for further evaluation.  Discussed f/u ultrasound.  He declines.  Follow liver panel.  Recent check wnl except for slightly elevated bilirubin.  Follow.

## 2015-07-15 NOTE — Assessment & Plan Note (Signed)
Low cholesterol diet and exercise.  Follow lipid panel.   

## 2015-07-15 NOTE — Assessment & Plan Note (Signed)
Physical today 07/10/15.  PSA 07/17/14 - 1.34.  Check psa with next labs.  Declines colonoscopy.  Discussed with him again today.  Declines.

## 2015-07-15 NOTE — Assessment & Plan Note (Signed)
Bilirubin check - stable.  Ultrasound as outlined.  Declines any further evaluation or w/up.

## 2015-07-20 ENCOUNTER — Encounter: Payer: BLUE CROSS/BLUE SHIELD | Admitting: Internal Medicine

## 2015-09-23 ENCOUNTER — Other Ambulatory Visit: Payer: Self-pay | Admitting: Internal Medicine

## 2015-10-10 ENCOUNTER — Encounter: Payer: Self-pay | Admitting: *Deleted

## 2015-11-05 ENCOUNTER — Other Ambulatory Visit: Payer: BLUE CROSS/BLUE SHIELD

## 2015-11-07 ENCOUNTER — Ambulatory Visit: Payer: BLUE CROSS/BLUE SHIELD | Admitting: Internal Medicine

## 2016-01-08 ENCOUNTER — Other Ambulatory Visit: Payer: BLUE CROSS/BLUE SHIELD

## 2016-01-10 ENCOUNTER — Ambulatory Visit: Payer: BLUE CROSS/BLUE SHIELD | Admitting: Internal Medicine

## 2016-01-16 ENCOUNTER — Other Ambulatory Visit (INDEPENDENT_AMBULATORY_CARE_PROVIDER_SITE_OTHER): Payer: BLUE CROSS/BLUE SHIELD

## 2016-01-16 DIAGNOSIS — E7800 Pure hypercholesterolemia, unspecified: Secondary | ICD-10-CM

## 2016-01-16 DIAGNOSIS — I10 Essential (primary) hypertension: Secondary | ICD-10-CM | POA: Diagnosis not present

## 2016-01-16 DIAGNOSIS — R945 Abnormal results of liver function studies: Secondary | ICD-10-CM

## 2016-01-16 DIAGNOSIS — E119 Type 2 diabetes mellitus without complications: Secondary | ICD-10-CM

## 2016-01-16 DIAGNOSIS — R7989 Other specified abnormal findings of blood chemistry: Secondary | ICD-10-CM | POA: Diagnosis not present

## 2016-01-16 LAB — CBC WITH DIFFERENTIAL/PLATELET
BASOS ABS: 0 10*3/uL (ref 0.0–0.1)
Basophils Relative: 0.4 % (ref 0.0–3.0)
EOS ABS: 0.4 10*3/uL (ref 0.0–0.7)
Eosinophils Relative: 5 % (ref 0.0–5.0)
HEMATOCRIT: 45.7 % (ref 39.0–52.0)
Hemoglobin: 15.8 g/dL (ref 13.0–17.0)
LYMPHS PCT: 37.2 % (ref 12.0–46.0)
Lymphs Abs: 3 10*3/uL (ref 0.7–4.0)
MCHC: 34.6 g/dL (ref 30.0–36.0)
MCV: 91.1 fl (ref 78.0–100.0)
MONOS PCT: 8.8 % (ref 3.0–12.0)
Monocytes Absolute: 0.7 10*3/uL (ref 0.1–1.0)
Neutro Abs: 4 10*3/uL (ref 1.4–7.7)
Neutrophils Relative %: 48.6 % (ref 43.0–77.0)
Platelets: 227 10*3/uL (ref 150.0–400.0)
RBC: 5.02 Mil/uL (ref 4.22–5.81)
RDW: 13.1 % (ref 11.5–15.5)
WBC: 8.2 10*3/uL (ref 4.0–10.5)

## 2016-01-16 LAB — HEPATIC FUNCTION PANEL
ALBUMIN: 4.6 g/dL (ref 3.5–5.2)
ALK PHOS: 74 U/L (ref 39–117)
ALT: 36 U/L (ref 0–53)
AST: 27 U/L (ref 0–37)
Bilirubin, Direct: 0.2 mg/dL (ref 0.0–0.3)
TOTAL PROTEIN: 7.2 g/dL (ref 6.0–8.3)
Total Bilirubin: 1.1 mg/dL (ref 0.2–1.2)

## 2016-01-16 LAB — BASIC METABOLIC PANEL
BUN: 16 mg/dL (ref 6–23)
CO2: 26 mEq/L (ref 19–32)
Calcium: 9.4 mg/dL (ref 8.4–10.5)
Chloride: 101 mEq/L (ref 96–112)
Creatinine, Ser: 1.07 mg/dL (ref 0.40–1.50)
GFR: 74.05 mL/min (ref >60.00)
Glucose, Bld: 124 mg/dL — ABNORMAL HIGH (ref 70–99)
POTASSIUM: 4 meq/L (ref 3.5–5.1)
SODIUM: 138 meq/L (ref 135–145)

## 2016-01-16 LAB — LIPID PANEL
CHOL/HDL RATIO: 5
CHOLESTEROL: 169 mg/dL (ref 0–200)
HDL: 36.7 mg/dL — AB (ref >39.00)
LDL CALC: 101 mg/dL — AB (ref 0–99)
NonHDL: 132.68
TRIGLYCERIDES: 157 mg/dL — AB (ref 0.0–149.0)
VLDL: 31.4 mg/dL (ref 0.0–40.0)

## 2016-01-16 LAB — TSH: TSH: 1.03 u[IU]/mL (ref 0.35–4.50)

## 2016-01-16 LAB — HEMOGLOBIN A1C: Hgb A1c MFr Bld: 6.1 % (ref 4.6–6.5)

## 2016-01-17 ENCOUNTER — Encounter: Payer: Self-pay | Admitting: Internal Medicine

## 2016-01-17 LAB — MICROALBUMIN / CREATININE URINE RATIO
CREATININE, U: 73.1 mg/dL
Microalb Creat Ratio: 1 mg/g (ref 0.0–30.0)
Microalb, Ur: <0.7 mg/dL (ref 0.0–1.9)

## 2016-01-18 ENCOUNTER — Ambulatory Visit (INDEPENDENT_AMBULATORY_CARE_PROVIDER_SITE_OTHER): Payer: BLUE CROSS/BLUE SHIELD | Admitting: Internal Medicine

## 2016-01-18 ENCOUNTER — Encounter: Payer: Self-pay | Admitting: Internal Medicine

## 2016-01-18 VITALS — BP 120/80 | HR 67 | Temp 98.1°F | Resp 18 | Ht 68.5 in | Wt 242.5 lb

## 2016-01-18 DIAGNOSIS — R945 Abnormal results of liver function studies: Secondary | ICD-10-CM

## 2016-01-18 DIAGNOSIS — E7800 Pure hypercholesterolemia, unspecified: Secondary | ICD-10-CM | POA: Diagnosis not present

## 2016-01-18 DIAGNOSIS — R7989 Other specified abnormal findings of blood chemistry: Secondary | ICD-10-CM

## 2016-01-18 DIAGNOSIS — Z125 Encounter for screening for malignant neoplasm of prostate: Secondary | ICD-10-CM

## 2016-01-18 DIAGNOSIS — I10 Essential (primary) hypertension: Secondary | ICD-10-CM | POA: Diagnosis not present

## 2016-01-18 DIAGNOSIS — E119 Type 2 diabetes mellitus without complications: Secondary | ICD-10-CM | POA: Diagnosis not present

## 2016-01-18 NOTE — Progress Notes (Signed)
Patient ID: Jamie Martin, male   DOB: [birth year], 65 y.o.   MRN: [id number]   Subjective:    Patient ID: Jamie Martin, male    DOB: 17-Mar-1951, 65 y.o.   MRN: [id number]  HPI  Patient with past history of diabetes, hypercholesterolemia and hypertension.  He comes in today to follow up on these issues.  Recently had leg pain.  Evaluated at acute care and by ortho.  Is better now.  States he is doing well.  No chest pain or tightness.  Has not been exercising regularly.  Plans to restart.  No nausea or vomiting.  No abdominal pain or cramping.  Bowels stable.     Past Medical History  Diagnosis Date  . History of hyperglycemia   . Hypertension   . Plantar fasciitis     History of  . Chicken pox    Past Surgical History  Procedure Laterality Date  . Tonsillectomy and adenoidectomy    . Arthroscopic knee      x2- Performed by Dr. by Leanor Kail  . Foot surgery  2009  . Plantar fasciectomy  2012  . Knee arthroscopy  [id number]    right knee  . Tonsillectomy     Family History  Problem Relation Age of Onset  . Hypertension Mother   . Arthritis Mother   . Stroke Father   . Colon cancer Neg Hx   . Prostate cancer Neg Hx    Social History   Social History  . Marital Status: Married    Spouse Name: N/A  . Number of Children: N/A  . Years of Education: N/A   Social History Main Topics  . Smoking status: Former Smoker    Quit date: 11/17/2005  . Smokeless tobacco: Never Used  . Alcohol Use: 0.0 oz/week    0 Standard drinks or equivalent per week  . Drug Use: No  . Sexual Activity: Not Asked   Other Topics Concern  . None   Social History Narrative   Married and has 2 children.    Outpatient Encounter Prescriptions as of 01/18/2016  Medication Sig  . Aspirin 81 MG EC tablet Take 81 mg by mouth daily.  . bisoprolol-hydrochlorothiazide (ZIAC) 10-6.25 MG tablet TAKE 1 TABLET DAILY  . lisinopril-hydrochlorothiazide (PRINZIDE,ZESTORETIC) 10-12.5 MG tablet TAKE 1  TABLET DAILY  . OMEGA 3 1000 MG CAPS Take by mouth 2 (two) times daily.  Glory Rosebush DELICA LANCETS [id number] MISC daily. for testing  . ONETOUCH VERIO test strip daily. for testing  . tadalafil (CIALIS) 5 MG tablet Take 1 tablet (5 mg total) by mouth daily as needed for erectile dysfunction.  . vitamin E 400 UNIT capsule Take 400 Units by mouth daily.   No facility-administered encounter medications on file as of 01/18/2016.    Review of Systems  Constitutional: Negative for appetite change and unexpected weight change.  HENT: Negative for congestion and sinus pressure.   Respiratory: Negative for cough, chest tightness and shortness of breath.   Cardiovascular: Negative for chest pain, palpitations and leg swelling.  Gastrointestinal: Negative for nausea, vomiting, abdominal pain and diarrhea.  Genitourinary: Negative for dysuria and difficulty urinating.  Musculoskeletal: Negative for back pain and joint swelling.  Skin: Negative for color change and rash.  Neurological: Negative for dizziness, light-headedness and headaches.  Psychiatric/Behavioral: Negative for decreased concentration and agitation.       Objective:     Blood pressure rechecked by me:  124/78  Physical Exam  Constitutional: He appears  well-developed and well-nourished. No distress.  HENT:  Nose: Nose normal.  Mouth/Throat: Oropharynx is clear and moist.  Neck: Neck supple. No thyromegaly present.  Cardiovascular: Normal rate and regular rhythm.   Pulmonary/Chest: Effort normal and breath sounds normal. No respiratory distress.  Abdominal: Soft. Bowel sounds are normal. There is no tenderness.  Musculoskeletal: He exhibits no edema or tenderness.  Lymphadenopathy:    He has no cervical adenopathy.  Skin: No rash noted. No erythema.  Psychiatric: He has a normal mood and affect. His behavior is normal.    BP 120/80 mmHg  Pulse 67  Temp(Src) 98.1 F (36.7 C) (Oral)  Resp 18  Ht 5' 8.5" (1.74 m)  Wt 242 lb 8  oz (109.997 kg)  BMI 36.33 kg/m2  SpO2 95% Wt Readings from Last 3 Encounters:  01/18/16 242 lb 8 oz (109.997 kg)  07/10/15 237 lb 9.6 oz (107.775 kg)  03/16/15 240 lb (108.863 kg)     Lab Results  Component Value Date   WBC 8.2 01/16/2016   HGB 15.8 01/16/2016   HCT 45.7 01/16/2016   PLT 227.0 01/16/2016   GLUCOSE 124* 01/16/2016   CHOL 169 01/16/2016   TRIG 157.0* 01/16/2016   HDL 36.70* 01/16/2016   LDLDIRECT 103.9 05/03/2013   LDLCALC 101* 01/16/2016   ALT 36 01/16/2016   AST 27 01/16/2016   NA 138 01/16/2016   K 4.0 01/16/2016   CL 101 01/16/2016   CREATININE 1.07 01/16/2016   BUN 16 01/16/2016   CO2 26 01/16/2016   TSH 1.03 01/16/2016   PSA 1.34 07/17/2014   INR 1.0 04/25/2011   HGBA1C 6.1 01/16/2016   MICROALBUR <0.7 01/16/2016       Assessment & Plan:   Problem List Items Addressed This Visit    Abnormal liver function tests    Recent liver function tests wnl.        Relevant Orders   Hepatic function panel   Diabetes mellitus ([place])    Low carb diet and exercise.  Plans to start exercising.   Lab Results  Component Value Date   HGBA1C 6.1 01/16/2016        Relevant Orders   Hemoglobin A1c   Hyperbilirubinemia    Recent bilirubin level wnl.        Hypercholesterolemia    Low cholesterol diet and exercise.  Follow lipid panel.        Relevant Orders   Lipid panel   Hypertension - Primary    Blood pressure under good control.  Continue same medication regimen.  Follow pressures.  Follow metabolic panel.        Relevant Orders   Basic metabolic panel    Other Visit Diagnoses    Prostate cancer screening        Relevant Orders    PSA        Einar Pheasant, MD

## 2016-01-18 NOTE — Progress Notes (Signed)
Pre-visit discussion using our clinic review tool. No additional management support is needed unless otherwise documented below in the visit note.  

## 2016-01-19 ENCOUNTER — Encounter: Payer: Self-pay | Admitting: Internal Medicine

## 2016-01-19 NOTE — Assessment & Plan Note (Signed)
Recent bilirubin level wnl.  

## 2016-01-19 NOTE — Assessment & Plan Note (Signed)
Low carb diet and exercise.  Plans to start exercising.   Lab Results  Component Value Date   HGBA1C 6.1 01/16/2016

## 2016-01-19 NOTE — Assessment & Plan Note (Signed)
Recent liver function tests wnl.  

## 2016-01-19 NOTE — Assessment & Plan Note (Signed)
Low cholesterol diet and exercise.  Follow lipid panel.   

## 2016-01-19 NOTE — Assessment & Plan Note (Signed)
Blood pressure under good control.  Continue same medication regimen.  Follow pressures.  Follow metabolic panel.   

## 2016-03-07 ENCOUNTER — Other Ambulatory Visit: Payer: Self-pay | Admitting: Internal Medicine

## 2016-07-23 ENCOUNTER — Other Ambulatory Visit (INDEPENDENT_AMBULATORY_CARE_PROVIDER_SITE_OTHER): Payer: BLUE CROSS/BLUE SHIELD

## 2016-07-23 DIAGNOSIS — E7800 Pure hypercholesterolemia, unspecified: Secondary | ICD-10-CM | POA: Diagnosis not present

## 2016-07-23 DIAGNOSIS — Z125 Encounter for screening for malignant neoplasm of prostate: Secondary | ICD-10-CM

## 2016-07-23 DIAGNOSIS — I10 Essential (primary) hypertension: Secondary | ICD-10-CM

## 2016-07-23 DIAGNOSIS — E119 Type 2 diabetes mellitus without complications: Secondary | ICD-10-CM | POA: Diagnosis not present

## 2016-07-23 DIAGNOSIS — R7989 Other specified abnormal findings of blood chemistry: Secondary | ICD-10-CM | POA: Diagnosis not present

## 2016-07-23 DIAGNOSIS — R945 Abnormal results of liver function studies: Secondary | ICD-10-CM

## 2016-07-23 LAB — LIPID PANEL
Cholesterol: 163 mg/dL (ref 0–200)
HDL: 35 mg/dL — AB (ref >39.00)
LDL Cholesterol: 97 mg/dL (ref 0–99)
NONHDL: 128.48
Total CHOL/HDL Ratio: 5
Triglycerides: 158 mg/dL — ABNORMAL HIGH (ref 0.0–149.0)
VLDL: 31.6 mg/dL (ref 0.0–40.0)

## 2016-07-23 LAB — BASIC METABOLIC PANEL
BUN: 20 mg/dL (ref 6–23)
CHLORIDE: 102 meq/L (ref 96–112)
CO2: 26 mEq/L (ref 19–32)
CREATININE: 1.16 mg/dL (ref 0.40–1.50)
Calcium: 9.3 mg/dL (ref 8.4–10.5)
GFR: 67.35 mL/min (ref >60.00)
GLUCOSE: 146 mg/dL — AB (ref 70–99)
Potassium: 4 mEq/L (ref 3.5–5.1)
Sodium: 138 mEq/L (ref 135–145)

## 2016-07-23 LAB — HEPATIC FUNCTION PANEL
ALBUMIN: 4.8 g/dL (ref 3.5–5.2)
ALK PHOS: 66 U/L (ref 39–117)
ALT: 43 U/L (ref 0–53)
AST: 31 U/L (ref 0–37)
BILIRUBIN DIRECT: 0.3 mg/dL (ref 0.0–0.3)
Total Bilirubin: 1.8 mg/dL — ABNORMAL HIGH (ref 0.2–1.2)
Total Protein: 7.3 g/dL (ref 6.0–8.3)

## 2016-07-23 LAB — HEMOGLOBIN A1C: Hgb A1c MFr Bld: 6.3 % (ref 4.6–6.5)

## 2016-07-24 ENCOUNTER — Encounter: Payer: Self-pay | Admitting: Internal Medicine

## 2016-07-24 ENCOUNTER — Ambulatory Visit (INDEPENDENT_AMBULATORY_CARE_PROVIDER_SITE_OTHER): Payer: BLUE CROSS/BLUE SHIELD | Admitting: Internal Medicine

## 2016-07-24 VITALS — BP 132/80 | HR 86 | Temp 98.3°F | Ht 68.0 in | Wt 241.0 lb

## 2016-07-24 DIAGNOSIS — E7800 Pure hypercholesterolemia, unspecified: Secondary | ICD-10-CM | POA: Diagnosis not present

## 2016-07-24 DIAGNOSIS — Z0000 Encounter for general adult medical examination without abnormal findings: Secondary | ICD-10-CM

## 2016-07-24 DIAGNOSIS — I10 Essential (primary) hypertension: Secondary | ICD-10-CM

## 2016-07-24 DIAGNOSIS — R7989 Other specified abnormal findings of blood chemistry: Secondary | ICD-10-CM

## 2016-07-24 DIAGNOSIS — Z23 Encounter for immunization: Secondary | ICD-10-CM | POA: Diagnosis not present

## 2016-07-24 DIAGNOSIS — R945 Abnormal results of liver function studies: Secondary | ICD-10-CM

## 2016-07-24 DIAGNOSIS — E119 Type 2 diabetes mellitus without complications: Secondary | ICD-10-CM

## 2016-07-24 LAB — PSA: PSA: 2.29 ng/mL (ref 0.10–4.00)

## 2016-07-24 NOTE — Progress Notes (Signed)
Pre visit review using our clinic review tool, if applicable. No additional management support is needed unless otherwise documented below in the visit note. 

## 2016-07-24 NOTE — Progress Notes (Signed)
Patient ID: Jamie Martin, male   DOB: [birth year], 65 y.o.   MRN: [id number]   Subjective:    Patient ID: Jamie Martin, male    DOB: 1951-07-17, 65 y.o.   MRN: [id number]  HPI  Patient here for a physical.  He is walking.  No chest pain.  No sob.  No significant congestion.  Stays active.  No acid reflux.  No abdominal pain or cramping.  Bowels stable.  Discussed his labs.  Discussed weight loss.  Discussed diet and exercise.     Past Medical History:  Diagnosis Date  . Chicken pox   . History of hyperglycemia   . Hypertension   . Plantar fasciitis    History of   Past Surgical History:  Procedure Laterality Date  . arthroscopic knee     x2- Performed by Dr. by Leanor Kail  . FOOT SURGERY  2009  . KNEE ARTHROSCOPY  0932,6712   right knee  . PLANTAR FASCIECTOMY  2012  . TONSILLECTOMY    . TONSILLECTOMY AND ADENOIDECTOMY     Family History  Problem Relation Age of Onset  . Hypertension Mother   . Arthritis Mother   . Stroke Father   . Colon cancer Neg Hx   . Prostate cancer Neg Hx    Social History   Social History  . Marital status: Married    Spouse name: N/A  . Number of children: N/A  . Years of education: N/A   Social History Main Topics  . Smoking status: Former Smoker    Quit date: 11/17/2005  . Smokeless tobacco: Never Used  . Alcohol use 0.0 oz/week  . Drug use: No  . Sexual activity: Not Asked   Other Topics Concern  . None   Social History Narrative   Married and has 2 children.    Outpatient Encounter Prescriptions as of 07/24/2016  Medication Sig  . Aspirin 81 MG EC tablet Take 81 mg by mouth daily.  . bisoprolol-hydrochlorothiazide (ZIAC) 10-6.25 MG tablet TAKE 1 TABLET DAILY  . lisinopril-hydrochlorothiazide (PRINZIDE,ZESTORETIC) 10-12.5 MG tablet TAKE 1 TABLET DAILY  . OMEGA 3 1000 MG CAPS Take by mouth 2 (two) times daily.  Glory Rosebush DELICA LANCETS 45Y MISC daily. for testing  . ONETOUCH VERIO test strip daily. for testing  .  tadalafil (CIALIS) 5 MG tablet Take 1 tablet (5 mg total) by mouth daily as needed for erectile dysfunction.  . vitamin E 400 UNIT capsule Take 400 Units by mouth daily.   No facility-administered encounter medications on file as of 07/24/2016.     Review of Systems  Constitutional: Negative for appetite change and unexpected weight change.  HENT: Negative for congestion and sinus pressure.   Eyes: Negative for pain and visual disturbance.  Respiratory: Negative for cough, chest tightness and shortness of breath.   Cardiovascular: Negative for chest pain, palpitations and leg swelling.  Gastrointestinal: Negative for abdominal pain, diarrhea, nausea and vomiting.  Genitourinary: Negative for difficulty urinating and dysuria.  Musculoskeletal: Negative for back pain and joint swelling.  Skin: Negative for color change and rash.  Neurological: Negative for dizziness, light-headedness and headaches.  Hematological: Negative for adenopathy. Does not bruise/bleed easily.  Psychiatric/Behavioral: Negative for agitation and dysphoric mood.       Objective:     Blood pressure rechecked by me:  132/80  Physical Exam  Constitutional: He is oriented to person, place, and time. He appears well-developed and well-nourished. No distress.  HENT:  Head: Normocephalic and  atraumatic.  Nose: Nose normal.  Mouth/Throat: Oropharynx is clear and moist. No oropharyngeal exudate.  Eyes: Conjunctivae are normal. Right eye exhibits no discharge. Left eye exhibits no discharge.  Neck: Neck supple. No thyromegaly present.  Cardiovascular: Normal rate and regular rhythm.   Pulmonary/Chest: Breath sounds normal. No respiratory distress. He has no wheezes.  Abdominal: Soft. Bowel sounds are normal. There is no tenderness.  Genitourinary:  Genitourinary Comments: Pt declined.    Musculoskeletal: He exhibits no edema or tenderness.  Lymphadenopathy:    He has no cervical adenopathy.  Neurological: He is  alert and oriented to person, place, and time.  Skin: Skin is warm and dry. No rash noted.  Psychiatric: He has a normal mood and affect. His behavior is normal.    BP 132/80   Pulse 86   Temp 98.3 F (36.8 C) (Oral)   Ht '5\' 8"'$  (1.727 m)   Wt 241 lb (109.3 kg)   SpO2 94%   BMI 36.64 kg/m  Wt Readings from Last 3 Encounters:  07/24/16 241 lb (109.3 kg)  01/18/16 242 lb 8 oz (110 kg)  07/10/15 237 lb 9.6 oz (107.8 kg)     Lab Results  Component Value Date   WBC 8.2 01/16/2016   HGB 15.8 01/16/2016   HCT 45.7 01/16/2016   PLT 227.0 01/16/2016   GLUCOSE 146 (H) 07/23/2016   CHOL 163 07/23/2016   TRIG 158.0 (H) 07/23/2016   HDL 35.00 (L) 07/23/2016   LDLDIRECT 103.9 05/03/2013   LDLCALC 97 07/23/2016   ALT 43 07/23/2016   AST 31 07/23/2016   NA 138 07/23/2016   K 4.0 07/23/2016   CL 102 07/23/2016   CREATININE 1.16 07/23/2016   BUN 20 07/23/2016   CO2 26 07/23/2016   TSH 1.03 01/16/2016   PSA 2.29 07/23/2016   INR 1.0 04/25/2011   HGBA1C 6.3 07/23/2016   MICROALBUR <0.7 01/16/2016       Assessment & Plan:   Problem List Items Addressed This Visit    Abnormal liver function tests    Liver function tests wnl.  Follow.       Relevant Orders   Hepatic function panel   Diabetes mellitus ([place])    Low carb diet and exercise.  Follow met b and a1c.   Lab Results  Component Value Date   HGBA1C 6.3 07/23/2016        Relevant Orders   Hemoglobin A1c   Health care maintenance    Physical today 07/24/16.  Declines colonoscopy.  PSA 2.29 07/23/16.  Recheck with next labs.  Discussed the need for colon cancer screening.  Declines cologuard.  Follow.       Hyperbilirubinemia    Recent bilirubin level elevated.  Has been stable.  Follow.        Hypercholesterolemia    Low cholesterol diet and exercise.  Follow lipid panel.  He declines cholesterol medication.        Relevant Orders   Lipid panel   Hypertension    Blood pressure on recheck improved.  Same  medication regimen.  Follow pressures.  Follow metabolic panel.        Relevant Orders   Basic metabolic panel    Other Visit Diagnoses    Routine general medical examination at a health care facility    -  Primary   Encounter for immunization       Relevant Orders   Flu Vaccine QUAD 36+ mos IM (Completed)  Einar Pheasant, MD

## 2016-07-24 NOTE — Assessment & Plan Note (Addendum)
Physical today 07/24/16.  Declines colonoscopy.  PSA 2.29 07/23/16.  Recheck with next labs.  Discussed the need for colon cancer screening.  Declines cologuard.  Follow.

## 2016-07-27 ENCOUNTER — Encounter: Payer: Self-pay | Admitting: Internal Medicine

## 2016-07-27 NOTE — Assessment & Plan Note (Signed)
Low carb diet and exercise.  Follow met b and a1c.   Lab Results  Component Value Date   HGBA1C 6.3 07/23/2016

## 2016-07-27 NOTE — Assessment & Plan Note (Signed)
Recent bilirubin level elevated.  Has been stable.  Follow.

## 2016-07-27 NOTE — Assessment & Plan Note (Signed)
Liver function tests wnl.  Follow.

## 2016-07-27 NOTE — Assessment & Plan Note (Signed)
Low cholesterol diet and exercise.  Follow lipid panel.  He declines cholesterol medication.   

## 2016-07-27 NOTE — Assessment & Plan Note (Signed)
Blood pressure on recheck improved.  Same medication regimen.  Follow pressures.  Follow metabolic panel.   

## 2016-10-14 ENCOUNTER — Telehealth: Payer: Self-pay | Admitting: *Deleted

## 2017-01-19 ENCOUNTER — Other Ambulatory Visit: Payer: BLUE CROSS/BLUE SHIELD

## 2017-01-21 ENCOUNTER — Ambulatory Visit: Payer: BLUE CROSS/BLUE SHIELD | Admitting: Internal Medicine

## 2017-01-29 ENCOUNTER — Other Ambulatory Visit: Payer: Self-pay | Admitting: Internal Medicine

## 2017-03-25 ENCOUNTER — Other Ambulatory Visit (INDEPENDENT_AMBULATORY_CARE_PROVIDER_SITE_OTHER): Payer: BLUE CROSS/BLUE SHIELD

## 2017-03-25 DIAGNOSIS — I10 Essential (primary) hypertension: Secondary | ICD-10-CM

## 2017-03-25 DIAGNOSIS — E7800 Pure hypercholesterolemia, unspecified: Secondary | ICD-10-CM | POA: Diagnosis not present

## 2017-03-25 DIAGNOSIS — E119 Type 2 diabetes mellitus without complications: Secondary | ICD-10-CM

## 2017-03-25 DIAGNOSIS — R945 Abnormal results of liver function studies: Secondary | ICD-10-CM | POA: Diagnosis not present

## 2017-03-25 DIAGNOSIS — R7989 Other specified abnormal findings of blood chemistry: Secondary | ICD-10-CM

## 2017-03-25 LAB — LIPID PANEL
CHOL/HDL RATIO: 4
CHOLESTEROL: 150 mg/dL (ref 0–200)
HDL: 37.1 mg/dL — ABNORMAL LOW (ref >39.00)
LDL Cholesterol: 87 mg/dL (ref 0–99)
NonHDL: 112.64
TRIGLYCERIDES: 130 mg/dL (ref 0.0–149.0)
VLDL: 26 mg/dL (ref 0.0–40.0)

## 2017-03-25 LAB — HEPATIC FUNCTION PANEL
ALK PHOS: 80 U/L (ref 39–117)
ALT: 38 U/L (ref 0–53)
AST: 29 U/L (ref 0–37)
Albumin: 4.6 g/dL (ref 3.5–5.2)
BILIRUBIN DIRECT: 0.2 mg/dL (ref 0.0–0.3)
BILIRUBIN TOTAL: 1.1 mg/dL (ref 0.2–1.2)
TOTAL PROTEIN: 7.3 g/dL (ref 6.0–8.3)

## 2017-03-25 LAB — BASIC METABOLIC PANEL
BUN: 22 mg/dL (ref 6–23)
CO2: 26 mEq/L (ref 19–32)
CREATININE: 1.25 mg/dL (ref 0.40–1.50)
Calcium: 9.3 mg/dL (ref 8.4–10.5)
Chloride: 103 mEq/L (ref 96–112)
GFR: 61.65 mL/min (ref >60.00)
Glucose, Bld: 152 mg/dL — ABNORMAL HIGH (ref 70–99)
POTASSIUM: 4.1 meq/L (ref 3.5–5.1)
Sodium: 136 mEq/L (ref 135–145)

## 2017-03-25 LAB — HEMOGLOBIN A1C: Hgb A1c MFr Bld: 6.5 % (ref 4.6–6.5)

## 2017-03-26 ENCOUNTER — Encounter: Payer: Self-pay | Admitting: Internal Medicine

## 2017-03-26 ENCOUNTER — Ambulatory Visit (INDEPENDENT_AMBULATORY_CARE_PROVIDER_SITE_OTHER): Payer: BLUE CROSS/BLUE SHIELD | Admitting: Internal Medicine

## 2017-03-26 VITALS — BP 132/74 | HR 72 | Temp 98.6°F | Resp 12 | Ht 68.0 in | Wt 246.6 lb

## 2017-03-26 DIAGNOSIS — R945 Abnormal results of liver function studies: Secondary | ICD-10-CM | POA: Diagnosis not present

## 2017-03-26 DIAGNOSIS — E119 Type 2 diabetes mellitus without complications: Secondary | ICD-10-CM

## 2017-03-26 DIAGNOSIS — M25561 Pain in right knee: Secondary | ICD-10-CM

## 2017-03-26 DIAGNOSIS — E7800 Pure hypercholesterolemia, unspecified: Secondary | ICD-10-CM

## 2017-03-26 DIAGNOSIS — I10 Essential (primary) hypertension: Secondary | ICD-10-CM

## 2017-03-26 DIAGNOSIS — R7989 Other specified abnormal findings of blood chemistry: Secondary | ICD-10-CM

## 2017-03-26 NOTE — Progress Notes (Signed)
Patient ID: Jamie Martin, male   DOB: Jan 22, 1951, 66 y.o.   MRN: [id number]   Subjective:    Patient ID: Jamie Martin, male    DOB: May 25, 1951, 66 y.o.   MRN: [id number]  HPI  Patient here for a scheduled follow up. States he is doing relatively well.  Has been having some increased pian in his right knee.  Saw ortho  s/p cortisone injection x 2.  Last 03/16/17.  Planning for synvisc in 05/2017.  No chest pain.  No sob.  No acid reflux.  No abdominal pain.  Bowels stable.  Discussed diet and exercise.  He is trying to watch his diet.     Past Medical History:  Diagnosis Date  . Chicken pox   . History of hyperglycemia   . Hypertension   . Plantar fasciitis    History of   Past Surgical History:  Procedure Laterality Date  . arthroscopic knee     x2- Performed by Dr. by Leanor Kail  . FOOT SURGERY  2009  . KNEE ARTHROSCOPY  [id number]   right knee  . PLANTAR FASCIECTOMY  2012  . TONSILLECTOMY    . TONSILLECTOMY AND ADENOIDECTOMY     Family History  Problem Relation Age of Onset  . Hypertension Mother   . Arthritis Mother   . Stroke Father   . Colon cancer Neg Hx   . Prostate cancer Neg Hx    Social History   Social History  . Marital status: Married    Spouse name: N/A  . Number of children: N/A  . Years of education: N/A   Social History Main Topics  . Smoking status: Former Smoker    Quit date: 11/17/2005  . Smokeless tobacco: Never Used  . Alcohol use 0.0 oz/week  . Drug use: No  . Sexual activity: Not Asked   Other Topics Concern  . None   Social History Narrative   Married and has 2 children.    Outpatient Encounter Prescriptions as of 03/26/2017  Medication Sig  . Aspirin 81 MG EC tablet Take 81 mg by mouth daily.  . bisoprolol-hydrochlorothiazide (ZIAC) 10-6.25 MG tablet TAKE 1 TABLET DAILY  . lisinopril-hydrochlorothiazide (PRINZIDE,ZESTORETIC) 10-12.5 MG tablet TAKE 1 TABLET DAILY  . ONETOUCH DELICA LANCETS 85O MISC daily. for testing  .  ONETOUCH VERIO test strip daily. for testing  . tadalafil (CIALIS) 5 MG tablet Take 1 tablet (5 mg total) by mouth daily as needed for erectile dysfunction.  . [DISCONTINUED] vitamin E 400 UNIT capsule Take 400 Units by mouth daily.  . meloxicam (MOBIC) 15 MG tablet Take 15 mg by mouth daily.  . OMEGA 3 1000 MG CAPS Take by mouth 2 (two) times daily.   No facility-administered encounter medications on file as of 03/26/2017.     Review of Systems  Constitutional: Negative for appetite change and unexpected weight change.  HENT: Negative for congestion and sinus pressure.   Respiratory: Negative for cough, chest tightness and shortness of breath.   Cardiovascular: Negative for chest pain, palpitations and leg swelling.  Gastrointestinal: Negative for abdominal pain, diarrhea, nausea and vomiting.  Genitourinary: Negative for difficulty urinating and dysuria.  Musculoskeletal: Negative for back pain and joint swelling.       Persistent knee pain as outlined.  S/p injection.  Seeing ortho.    Skin: Negative for color change and rash.  Neurological: Negative for dizziness, light-headedness and headaches.  Psychiatric/Behavioral: Negative for agitation and dysphoric mood.  Objective:    Physical Exam  Constitutional: He appears well-developed and well-nourished. No distress.  HENT:  Nose: Nose normal.  Mouth/Throat: Oropharynx is clear and moist.  Neck: Neck supple. No thyromegaly present.  Cardiovascular: Normal rate and regular rhythm.   Pulmonary/Chest: Effort normal and breath sounds normal. No respiratory distress.  Abdominal: Soft. Bowel sounds are normal. There is no tenderness.  Musculoskeletal: He exhibits no edema or tenderness.  Lymphadenopathy:    He has no cervical adenopathy.  Skin: No rash noted. No erythema.  Psychiatric: He has a normal mood and affect. His behavior is normal.    BP 132/74 (BP Location: Left Arm, Patient Position: Sitting, Cuff Size: Normal)    Pulse 72   Temp 98.6 F (37 C) (Oral)   Resp 12   Ht '5\' 8"'  (1.727 m)   Wt 246 lb 9.6 oz (111.9 kg)   SpO2 98%   BMI 37.50 kg/m  Wt Readings from Last 3 Encounters:  03/26/17 246 lb 9.6 oz (111.9 kg)  07/24/16 241 lb (109.3 kg)  01/18/16 242 lb 8 oz (110 kg)     Lab Results  Component Value Date   WBC 8.2 01/16/2016   HGB 15.8 01/16/2016   HCT 45.7 01/16/2016   PLT 227.0 01/16/2016   GLUCOSE 152 (H) 03/25/2017   CHOL 150 03/25/2017   TRIG 130.0 03/25/2017   HDL 37.10 (L) 03/25/2017   LDLDIRECT 103.9 05/03/2013   LDLCALC 87 03/25/2017   ALT 38 03/25/2017   AST 29 03/25/2017   NA 136 03/25/2017   K 4.1 03/25/2017   CL 103 03/25/2017   CREATININE 1.25 03/25/2017   BUN 22 03/25/2017   CO2 26 03/25/2017   TSH 1.03 01/16/2016   PSA 2.29 07/23/2016   INR 1.0 04/25/2011   HGBA1C 6.5 03/25/2017   MICROALBUR <0.7 01/16/2016       Assessment & Plan:   Problem List Items Addressed This Visit    Abnormal liver function tests    Recent liver panel wnl.        Diabetes mellitus ([place])    Low carb diet and exercise.  Follow met b and a1c.        Relevant Orders   Hemoglobin A1c   Microalbumin / creatinine urine ratio   Hyperbilirubinemia    Recent check wnl.        Hypercholesterolemia    Low cholesterol diet and exercise.  Follow lipid panel.  He has declined cholesterol medication.        Relevant Orders   Hepatic function panel   Lipid panel   Hypertension    Blood pressure under good control.  Continue same medication regimen.  Follow pressures.  Follow metabolic panel.        Relevant Orders   CBC with Differential/Platelet   TSH   Basic metabolic panel    Other Visit Diagnoses    Right knee pain, unspecified chronicity    -  Primary   right knee pain as outlined.  seeing ortho.  planning for synvisc 05/2017.         Einar Pheasant, MD

## 2017-03-26 NOTE — Progress Notes (Signed)
Pre-visit discussion using our clinic review tool. No additional management support is needed unless otherwise documented below in the visit note.  

## 2017-03-29 ENCOUNTER — Encounter: Payer: Self-pay | Admitting: Internal Medicine

## 2017-03-29 NOTE — Assessment & Plan Note (Signed)
Low cholesterol diet and exercise.  Follow lipid panel.  He has declined cholesterol medication.   

## 2017-03-29 NOTE — Assessment & Plan Note (Signed)
Blood pressure under good control.  Continue same medication regimen.  Follow pressures.  Follow metabolic panel.   

## 2017-03-29 NOTE — Assessment & Plan Note (Signed)
Low carb diet and exercise.  Follow met b and a1c.   

## 2017-03-29 NOTE — Assessment & Plan Note (Signed)
Recent liver panel wnl.  

## 2017-03-29 NOTE — Assessment & Plan Note (Signed)
Recent check wnl.

## 2017-06-14 ENCOUNTER — Other Ambulatory Visit: Payer: Self-pay | Admitting: Internal Medicine

## 2017-06-14 ENCOUNTER — Encounter: Payer: Self-pay | Admitting: Internal Medicine

## 2017-06-15 DIAGNOSIS — H2513 Age-related nuclear cataract, bilateral: Secondary | ICD-10-CM | POA: Diagnosis not present

## 2017-06-15 MED ORDER — BISOPROLOL-HYDROCHLOROTHIAZIDE 10-6.25 MG PO TABS: 1.0000 | ORAL_TABLET | Freq: Every day | ORAL | 1 refills | Status: DC

## 2017-06-15 MED ORDER — LISINOPRIL-HYDROCHLOROTHIAZIDE 10-12.5 MG PO TABS: 1.0000 | ORAL_TABLET | Freq: Every day | ORAL | 1 refills | Status: DC

## 2017-06-15 NOTE — Telephone Encounter (Signed)
Ok'd rx for lisinopril/hctz and bisoprolol/hctz.  (#90 with one refill)

## 2017-06-15 NOTE — Telephone Encounter (Signed)
Should he be on both of these? Please advise, thanks

## 2017-07-30 DIAGNOSIS — M1711 Unilateral primary osteoarthritis, right knee: Secondary | ICD-10-CM | POA: Diagnosis not present

## 2017-07-30 DIAGNOSIS — E669 Obesity, unspecified: Secondary | ICD-10-CM | POA: Diagnosis not present

## 2017-07-30 DIAGNOSIS — E119 Type 2 diabetes mellitus without complications: Secondary | ICD-10-CM | POA: Diagnosis not present

## 2017-09-24 ENCOUNTER — Encounter: Payer: Self-pay | Admitting: Internal Medicine

## 2017-09-24 ENCOUNTER — Ambulatory Visit (INDEPENDENT_AMBULATORY_CARE_PROVIDER_SITE_OTHER): Payer: Medicare Other | Admitting: Internal Medicine

## 2017-09-24 VITALS — BP 130/82 | HR 75 | Temp 98.3°F | Resp 18 | Ht 68.11 in | Wt 245.6 lb

## 2017-09-24 DIAGNOSIS — I10 Essential (primary) hypertension: Secondary | ICD-10-CM | POA: Diagnosis not present

## 2017-09-24 DIAGNOSIS — E7800 Pure hypercholesterolemia, unspecified: Secondary | ICD-10-CM

## 2017-09-24 DIAGNOSIS — Z125 Encounter for screening for malignant neoplasm of prostate: Secondary | ICD-10-CM

## 2017-09-24 DIAGNOSIS — E119 Type 2 diabetes mellitus without complications: Secondary | ICD-10-CM | POA: Diagnosis not present

## 2017-09-24 DIAGNOSIS — Z0000 Encounter for general adult medical examination without abnormal findings: Secondary | ICD-10-CM

## 2017-09-24 LAB — CBC WITH DIFFERENTIAL/PLATELET
BASOS ABS: 0 10*3/uL (ref 0.0–0.1)
BASOS PCT: 0.4 % (ref 0.0–3.0)
EOS ABS: 0.2 10*3/uL (ref 0.0–0.7)
Eosinophils Relative: 3.5 % (ref 0.0–5.0)
HCT: 47.1 % (ref 39.0–52.0)
Hemoglobin: 16 g/dL (ref 13.0–17.0)
LYMPHS PCT: 28.2 % (ref 12.0–46.0)
Lymphs Abs: 1.9 10*3/uL (ref 0.7–4.0)
MCHC: 33.9 g/dL (ref 30.0–36.0)
MCV: 94 fl (ref 78.0–100.0)
Monocytes Absolute: 0.6 10*3/uL (ref 0.1–1.0)
Monocytes Relative: 9.1 % (ref 3.0–12.0)
NEUTROS ABS: 4 10*3/uL (ref 1.4–7.7)
NEUTROS PCT: 58.8 % (ref 43.0–77.0)
PLATELETS: 214 10*3/uL (ref 150.0–400.0)
RBC: 5.01 Mil/uL (ref 4.22–5.81)
RDW: 13.1 % (ref 11.5–15.5)
WBC: 6.7 10*3/uL (ref 4.0–10.5)

## 2017-09-24 LAB — TSH: TSH: 1.15 u[IU]/mL (ref 0.35–4.50)

## 2017-09-24 LAB — LIPID PANEL
CHOL/HDL RATIO: 4
CHOLESTEROL: 166 mg/dL (ref 0–200)
HDL: 43 mg/dL (ref >39.00)
LDL CALC: 98 mg/dL (ref 0–99)
NonHDL: 122.75
TRIGLYCERIDES: 126 mg/dL (ref 0.0–149.0)
VLDL: 25.2 mg/dL (ref 0.0–40.0)

## 2017-09-24 LAB — BASIC METABOLIC PANEL
BUN: 19 mg/dL (ref 6–23)
CALCIUM: 9.5 mg/dL (ref 8.4–10.5)
CO2: 27 meq/L (ref 19–32)
Chloride: 102 mEq/L (ref 96–112)
Creatinine, Ser: 1.11 mg/dL (ref 0.40–1.50)
GFR: 70.6 mL/min (ref >60.00)
GLUCOSE: 162 mg/dL — AB (ref 70–99)
POTASSIUM: 4.1 meq/L (ref 3.5–5.1)
SODIUM: 137 meq/L (ref 135–145)

## 2017-09-24 LAB — HEPATIC FUNCTION PANEL
ALBUMIN: 4.6 g/dL (ref 3.5–5.2)
ALK PHOS: 66 U/L (ref 39–117)
ALT: 47 U/L (ref 0–53)
AST: 33 U/L (ref 0–37)
BILIRUBIN DIRECT: 0.2 mg/dL (ref 0.0–0.3)
TOTAL PROTEIN: 7.1 g/dL (ref 6.0–8.3)
Total Bilirubin: 1.4 mg/dL — ABNORMAL HIGH (ref 0.2–1.2)

## 2017-09-24 LAB — HEMOGLOBIN A1C: Hgb A1c MFr Bld: 6.4 % (ref 4.6–6.5)

## 2017-09-24 LAB — PSA, MEDICARE: PSA: 2.26 ng/mL (ref 0.10–4.00)

## 2017-09-24 NOTE — Assessment & Plan Note (Addendum)
Physical today 09/24/17.  Decline colonoscopy and cologuard.  Discussed with him today.  Check psa.

## 2017-09-24 NOTE — Progress Notes (Signed)
Patient ID: Jamie Martin, male   DOB: 05-01-1951, 66 y.o.   MRN: [id number]   Subjective:    Patient ID: Jamie Martin, male    DOB: [birth year], 66 y.o.   MRN: [id number]  HPI  Patient with past history of hypertension and hyperglycemia.  He comes in today to follow up on these issues as well as for a complete physical exam.  He reports he is doing well.  Feels good.  Staying active.  No chest pain.  No sob.  No acid reflux.  No abdominal pain or cramping.  Having some right knee pain.  S/p injection.  Desires no further intervention at this time.     Past Medical History:  Diagnosis Date  . Chicken pox   . History of hyperglycemia   . Hypertension   . Plantar fasciitis    History of   Past Surgical History:  Procedure Laterality Date  . arthroscopic knee     x2- Performed by Dr. by Leanor Kail  . FOOT SURGERY  2009  . KNEE ARTHROSCOPY  [id number]   right knee  . PLANTAR FASCIECTOMY  2012  . TONSILLECTOMY    . TONSILLECTOMY AND ADENOIDECTOMY     Family History  Problem Relation Age of Onset  . Hypertension Mother   . Arthritis Mother   . Stroke Father   . Colon cancer Neg Hx   . Prostate cancer Neg Hx    Social History   Socioeconomic History  . Marital status: Married    Spouse name: None  . Number of children: None  . Years of education: None  . Highest education level: None  Social Needs  . Financial resource strain: None  . Food insecurity - worry: None  . Food insecurity - inability: None  . Transportation needs - medical: None  . Transportation needs - non-medical: None  Occupational History  . None  Tobacco Use  . Smoking status: Former Smoker    Last attempt to quit: 11/17/2005    Years since quitting: 11.8  . Smokeless tobacco: Never Used  Substance and Sexual Activity  . Alcohol use: Yes    Alcohol/week: 0.0 oz  . Drug use: No  . Sexual activity: None  Other Topics Concern  . None  Social History Narrative   Married and has 2 children.      Outpatient Encounter Medications as of 09/24/2017  Medication Sig  . Aspirin 81 MG EC tablet Take 81 mg by mouth daily.  . bisoprolol-hydrochlorothiazide (ZIAC) 10-6.25 MG tablet Take 1 tablet by mouth daily.  Marland Kitchen lisinopril-hydrochlorothiazide (PRINZIDE,ZESTORETIC) 10-12.5 MG tablet Take 1 tablet by mouth daily.  . meloxicam (MOBIC) 15 MG tablet Take 15 mg by mouth daily.  . OMEGA 3 1000 MG CAPS Take by mouth 2 (two) times daily.  Glory Rosebush DELICA LANCETS 80D MISC daily. for testing  . ONETOUCH VERIO test strip daily. for testing  . tadalafil (CIALIS) 5 MG tablet Take 1 tablet (5 mg total) by mouth daily as needed for erectile dysfunction.   No facility-administered encounter medications on file as of 09/24/2017.     Review of Systems  Constitutional: Negative for appetite change and unexpected weight change.  HENT: Negative for congestion and sinus pressure.   Eyes: Negative for pain and visual disturbance.  Respiratory: Negative for cough, chest tightness and shortness of breath.   Cardiovascular: Negative for chest pain, palpitations and leg swelling.  Gastrointestinal: Negative for abdominal pain, diarrhea, nausea and vomiting.  Genitourinary:  Negative for difficulty urinating and dysuria.  Musculoskeletal: Negative for back pain and joint swelling.  Skin: Negative for color change and rash.  Neurological: Negative for dizziness and headaches.  Hematological: Negative for adenopathy. Does not bruise/bleed easily.  Psychiatric/Behavioral: Negative for agitation and dysphoric mood.       Objective:     Blood pressure rechecked by me:  130/82  Physical Exam  Constitutional: He is oriented to person, place, and time. He appears well-developed and well-nourished. No distress.  HENT:  Head: Normocephalic and atraumatic.  Nose: Nose normal.  Mouth/Throat: Oropharynx is clear and moist. No oropharyngeal exudate.  Eyes: Conjunctivae are normal. Right eye exhibits no discharge.  Left eye exhibits no discharge.  Neck: Neck supple. No thyromegaly present.  Cardiovascular: Normal rate and regular rhythm.  Pulmonary/Chest: Breath sounds normal. No respiratory distress. He has no wheezes.  Abdominal: Soft. Bowel sounds are normal. There is no tenderness.  Genitourinary:  Genitourinary Comments: He declined.   Musculoskeletal: He exhibits no edema or tenderness.  Lymphadenopathy:    He has no cervical adenopathy.  Neurological: He is alert and oriented to person, place, and time.  Skin: Skin is warm and dry. No rash noted. No erythema.  Psychiatric: He has a normal mood and affect. His behavior is normal.    BP 130/82   Pulse 75   Temp 98.3 F (36.8 C) (Oral)   Resp 18   Ht 5' 8.11" (1.73 m)   Wt 245 lb 9.6 oz (111.4 kg)   SpO2 98%   BMI 37.22 kg/m  Wt Readings from Last 3 Encounters:  09/24/17 245 lb 9.6 oz (111.4 kg)  03/26/17 246 lb 9.6 oz (111.9 kg)  07/24/16 241 lb (109.3 kg)     Lab Results  Component Value Date   WBC 6.7 09/24/2017   HGB 16.0 09/24/2017   HCT 47.1 09/24/2017   PLT 214.0 09/24/2017   GLUCOSE 162 (H) 09/24/2017   CHOL 166 09/24/2017   TRIG 126.0 09/24/2017   HDL 43.00 09/24/2017   LDLDIRECT 103.9 05/03/2013   LDLCALC 98 09/24/2017   ALT 47 09/24/2017   AST 33 09/24/2017   NA 137 09/24/2017   K 4.1 09/24/2017   CL 102 09/24/2017   CREATININE 1.11 09/24/2017   BUN 19 09/24/2017   CO2 27 09/24/2017   TSH 1.15 09/24/2017   PSA 2.26 09/24/2017   INR 1.0 04/25/2011   HGBA1C 6.4 09/24/2017   MICROALBUR <0.7 01/16/2016       Assessment & Plan:   Problem List Items Addressed This Visit    Diabetes mellitus ([place])    Low carb diet and exercise.  Follow met b and a1c.        Health care maintenance    Physical today 09/24/17.  Decline colonoscopy and cologuard.  Discussed with him today.  Check psa.        Hypercholesterolemia    Low cholesterol diet and exercise.  Follow lipid panel.  He has declined cholesterol  medication.        Hypertension    Blood pressure under good control.  Continue same medication regimen.  Follow pressures.  Follow metabolic panel.         Other Visit Diagnoses    Routine general medical examination at a health care facility    -  Primary   Prostate cancer screening       Relevant Orders   PSA, Medicare (Completed)       Einar Pheasant, MD

## 2017-09-25 ENCOUNTER — Encounter: Payer: Self-pay | Admitting: Internal Medicine

## 2017-09-26 ENCOUNTER — Encounter: Payer: Self-pay | Admitting: Internal Medicine

## 2017-09-26 NOTE — Assessment & Plan Note (Signed)
Low cholesterol diet and exercise.  Follow lipid panel.  He has declined cholesterol medication.

## 2017-09-26 NOTE — Assessment & Plan Note (Signed)
Low carb diet and exercise.  Follow met b and a1c.   

## 2017-09-26 NOTE — Assessment & Plan Note (Signed)
Blood pressure under good control.  Continue same medication regimen.  Follow pressures.  Follow metabolic panel.   

## 2017-12-04 ENCOUNTER — Other Ambulatory Visit: Payer: Self-pay | Admitting: Internal Medicine

## 2017-12-14 ENCOUNTER — Encounter: Payer: Self-pay | Admitting: Internal Medicine

## 2017-12-14 ENCOUNTER — Telehealth: Payer: Self-pay

## 2017-12-14 NOTE — Telephone Encounter (Signed)
We will be sending a message to the billing dept to have pt claim be resubmitted.

## 2017-12-14 NOTE — Telephone Encounter (Signed)
Copied from [place]. Topic: Medicare AWV >> Dec 14, 2017  8:38 AM Aurelio Brash B wrote: Reason for CRM:  pt is interested in  [facility].  He has not scheduled a  welcome to medicare visit yet,  he has questions for the  health coach and would like a call to dicuss the Medicare visits

## 2017-12-14 NOTE — Telephone Encounter (Signed)
This has been taken care of by coding the visit will be resubmitted. We do explain and give out paperwork to every patient on the difference between a well visit and a sick visit . It is left up to the patient to listen and also read the information given at check in.

## 2018-01-14 NOTE — Telephone Encounter (Signed)
Patient called and stated that he is still getting a bill for this. He said that he spoke with someone in the office & said they would re-submit this. Please call patient @ [phone]. He has a past due bill he states, he does not want this to be turned into collections.

## 2018-02-26 ENCOUNTER — Ambulatory Visit (INDEPENDENT_AMBULATORY_CARE_PROVIDER_SITE_OTHER): Payer: Medicare Other | Admitting: Internal Medicine

## 2018-02-26 DIAGNOSIS — I10 Essential (primary) hypertension: Secondary | ICD-10-CM | POA: Diagnosis not present

## 2018-02-26 DIAGNOSIS — E7800 Pure hypercholesterolemia, unspecified: Secondary | ICD-10-CM | POA: Diagnosis not present

## 2018-02-26 DIAGNOSIS — Z0000 Encounter for general adult medical examination without abnormal findings: Secondary | ICD-10-CM | POA: Diagnosis not present

## 2018-02-26 DIAGNOSIS — E119 Type 2 diabetes mellitus without complications: Secondary | ICD-10-CM

## 2018-02-26 NOTE — Progress Notes (Signed)
Patient ID: Jamie Martin, male   DOB: 1951/10/05, 67 y.o.   MRN: [id number]   The patient is here for Welcome to Medicare wellness examination and management of other chronic and acute problems.   The risk factors are reflected in the social history.  The roster of all physicians providing medical care:  Dr Leanor Kail (ortho) and [facility].    Activities of daily living:  The patient is 100% independent in all ADLs: dressing, toileting, feeding as well as independent mobility  Home safety : The patient has smoke detectors in the home. He wears seatbelts.  There are firearms at home. There is no violence in the home.   There is no risks for hepatitis, STDs or HIV. There is no history of blood transfusion. He has  no travel history to infectious disease endemic areas of the world.  The patient has seen his dentist on a regular basis. He has seen the eye doctor in the last year. No hearing difficulty with regard to whispered voices, etc.   Diet: the importance of a healthy diet is discussed.  He does try to watch what he eats.   The benefits of regular aerobic exercise were discussed. He walks 45 minutes per day for at least 5 days/week.   Depression screen: there are no signs or vegative symptoms of depression- irritability, change in appetite, anhedonia, sadness/tearfullness.  Cognitive assessment: the patient manages all his financial and personal affairs and is actively engaged. He could relate day,date,year and events; recalled 3/3 objects at 3 minutes.   The following portions of the patient's history were reviewed and updated as appropriate: allergies, current medications, past family history, past medical history,  past surgical history, past social history  and problem list.  Visual acuity was not assessed per patient preference since he has regular follow up with her ophthalmologist. Body mass index were assessed and reviewed.   During the course of the visit the  patient was educated and counseled about appropriate screening and preventive services including : fall prevention , diabetes screening, nutrition counseling, colorectal cancer screening, and recommended immunizations.    Have discussed the need for colonoscopy.  Refuses.  Discussed shinrix.  Thinks has had tetanus within the last 10 years.    Discussed living will and health/legal power of attorney.  States he and his wife have already met and has this arranged.      Subjective:    Patient ID: Jamie Martin, male    DOB: 1951/08/18, 67 y.o.   MRN: [id number]  HPI  Patient here for his welcome to medicare wellness exam.  Doing well.  No complaints.     Past Medical History:  Diagnosis Date  . Chicken pox   . History of hyperglycemia   . Hypertension   . Plantar fasciitis    History of   Past Surgical History:  Procedure Laterality Date  . arthroscopic knee     x2- Performed by Dr. by Leanor Kail  . FOOT SURGERY  2009  . KNEE ARTHROSCOPY  2620,3559   right knee  . PLANTAR FASCIECTOMY  2012  . TONSILLECTOMY    . TONSILLECTOMY AND ADENOIDECTOMY     Family History  Problem Relation Age of Onset  . Hypertension Mother   . Arthritis Mother   . Stroke Father   . Colon cancer Neg Hx   . Prostate cancer Neg Hx    Social History   Socioeconomic History  . Marital status: Married  Spouse name: Not on file  . Number of children: Not on file  . Years of education: Not on file  . Highest education level: Not on file  Occupational History  . Not on file  Social Needs  . Financial resource strain: Not on file  . Food insecurity:    Worry: Not on file    Inability: Not on file  . Transportation needs:    Medical: Not on file    Non-medical: Not on file  Tobacco Use  . Smoking status: Former Smoker    Last attempt to quit: 11/17/2005    Years since quitting: 12.2  . Smokeless tobacco: Never Used  Substance and Sexual Activity  . Alcohol use: Yes    Alcohol/week:  0.0 oz  . Drug use: No  . Sexual activity: Not on file  Lifestyle  . Physical activity:    Days per week: Not on file    Minutes per session: Not on file  . Stress: Not on file  Relationships  . Social connections:    Talks on phone: Not on file    Gets together: Not on file    Attends religious service: Not on file    Active member of club or organization: Not on file    Attends meetings of clubs or organizations: Not on file    Relationship status: Not on file  Other Topics Concern  . Not on file  Social History Narrative   Married and has 2 children.    Outpatient Encounter Medications as of 02/26/2018  Medication Sig  . Aspirin 81 MG EC tablet Take 81 mg by mouth daily.  . bisoprolol-hydrochlorothiazide (ZIAC) 10-6.25 MG tablet TAKE 1 TABLET DAILY  . lisinopril-hydrochlorothiazide (PRINZIDE,ZESTORETIC) 10-12.5 MG tablet TAKE 1 TABLET DAILY  . OMEGA 3 1000 MG CAPS Take by mouth 2 (two) times daily.  . tadalafil (CIALIS) 5 MG tablet Take 1 tablet (5 mg total) by mouth daily as needed for erectile dysfunction.  . [DISCONTINUED] meloxicam (MOBIC) 15 MG tablet Take 15 mg by mouth daily.  . [DISCONTINUED] ONETOUCH DELICA LANCETS 56E MISC daily. for testing  . [DISCONTINUED] ONETOUCH VERIO test strip daily. for testing   No facility-administered encounter medications on file as of 02/26/2018.     Review of Systems  Constitutional: Negative for appetite change and unexpected weight change.  Respiratory: Negative for chest tightness and shortness of breath.   Cardiovascular: Negative for chest pain, palpitations and leg swelling.  Gastrointestinal: Negative for abdominal pain, diarrhea, nausea and vomiting.  Skin: Negative for color change and rash.  Neurological: Negative for dizziness and headaches.  Psychiatric/Behavioral: Negative for agitation and dysphoric mood.       Objective:    Physical Exam  Constitutional: He appears well-developed and well-nourished. No  distress.  HENT:  Nose: Nose normal.  Mouth/Throat: No oropharyngeal exudate.  Neck: Neck supple. No thyromegaly present.  Cardiovascular: Normal rate and regular rhythm.  Pulmonary/Chest: Effort normal and breath sounds normal. No respiratory distress.  Abdominal: Soft. Bowel sounds are normal. There is no tenderness.  Musculoskeletal: He exhibits no edema or tenderness.  Lymphadenopathy:    He has no cervical adenopathy.  Skin: No rash noted. No erythema.  Psychiatric: He has a normal mood and affect. His behavior is normal.    BP 140/88 (BP Location: Left Arm, Patient Position: Sitting, Cuff Size: Large)   Pulse 66   Temp 97.8 F (36.6 C) (Oral)   Resp 18   Wt 244 lb 6.4  oz (110.9 kg)   SpO2 95%   BMI 37.04 kg/m  Wt Readings from Last 3 Encounters:  02/26/18 244 lb 6.4 oz (110.9 kg)  09/24/17 245 lb 9.6 oz (111.4 kg)  03/26/17 246 lb 9.6 oz (111.9 kg)     Lab Results  Component Value Date   WBC 6.7 09/24/2017   HGB 16.0 09/24/2017   HCT 47.1 09/24/2017   PLT 214.0 09/24/2017   GLUCOSE 162 (H) 09/24/2017   CHOL 166 09/24/2017   TRIG 126.0 09/24/2017   HDL 43.00 09/24/2017   LDLDIRECT 103.9 05/03/2013   LDLCALC 98 09/24/2017   ALT 47 09/24/2017   AST 33 09/24/2017   NA 137 09/24/2017   K 4.1 09/24/2017   CL 102 09/24/2017   CREATININE 1.11 09/24/2017   BUN 19 09/24/2017   CO2 27 09/24/2017   TSH 1.15 09/24/2017   PSA 2.26 09/24/2017   INR 1.0 04/25/2011   HGBA1C 6.4 09/24/2017   MICROALBUR <0.7 01/16/2016    No results found.     Assessment & Plan:   Problem List Items Addressed This Visit    Diabetes mellitus ([place])    Low carb diet and exercise.  Follow met b and a1c.       Hypercholesterolemia    Low cholesterol diet and exercise.  Follow lipid panel.       Hypertension    Blood pressure has been under good control.  Continue same medication regimen.  Follow pressures.           Einar Pheasant, MD

## 2018-02-28 ENCOUNTER — Encounter: Payer: Self-pay | Admitting: Internal Medicine

## 2018-02-28 NOTE — Assessment & Plan Note (Signed)
Blood pressure has been under good control.  Continue same medication regimen.  Follow pressures.

## 2018-02-28 NOTE — Assessment & Plan Note (Signed)
Low carb diet and exercise.  Follow met b and a1c.  

## 2018-02-28 NOTE — Assessment & Plan Note (Signed)
Low cholesterol diet and exercise.  Follow lipid panel.   

## 2018-03-19 ENCOUNTER — Encounter: Payer: Self-pay | Admitting: Internal Medicine

## 2018-03-19 NOTE — Telephone Encounter (Signed)
Advised urgent care or walk-in. Patient agreed to comply

## 2018-03-26 DIAGNOSIS — E119 Type 2 diabetes mellitus without complications: Secondary | ICD-10-CM | POA: Diagnosis not present

## 2018-03-26 DIAGNOSIS — M1711 Unilateral primary osteoarthritis, right knee: Secondary | ICD-10-CM | POA: Diagnosis not present

## 2018-03-31 ENCOUNTER — Ambulatory Visit: Payer: BLUE CROSS/BLUE SHIELD | Admitting: Internal Medicine

## 2018-05-04 DIAGNOSIS — L578 Other skin changes due to chronic exposure to nonionizing radiation: Secondary | ICD-10-CM | POA: Diagnosis not present

## 2018-05-04 DIAGNOSIS — L918 Other hypertrophic disorders of the skin: Secondary | ICD-10-CM | POA: Diagnosis not present

## 2018-05-04 DIAGNOSIS — Z1283 Encounter for screening for malignant neoplasm of skin: Secondary | ICD-10-CM | POA: Diagnosis not present

## 2018-05-04 DIAGNOSIS — M1711 Unilateral primary osteoarthritis, right knee: Secondary | ICD-10-CM | POA: Diagnosis not present

## 2018-05-12 DIAGNOSIS — M79674 Pain in right toe(s): Secondary | ICD-10-CM | POA: Diagnosis not present

## 2018-05-12 DIAGNOSIS — M2021 Hallux rigidus, right foot: Secondary | ICD-10-CM | POA: Diagnosis not present

## 2018-06-08 ENCOUNTER — Other Ambulatory Visit: Payer: Self-pay | Admitting: Internal Medicine

## 2018-06-10 ENCOUNTER — Telehealth: Payer: Self-pay | Admitting: Internal Medicine

## 2018-06-10 ENCOUNTER — Other Ambulatory Visit: Payer: Self-pay

## 2018-06-10 MED ORDER — BISOPROLOL-HYDROCHLOROTHIAZIDE 10-6.25 MG PO TABS: 1.0000 | ORAL_TABLET | Freq: Every day | ORAL | 1 refills | Status: DC

## 2018-06-10 NOTE — Telephone Encounter (Signed)
Copied from [place] [id number]. Topic: Quick Communication - See Telephone Encounter >> Jun 10, 2018  3:37 PM Synthia Innocent wrote: CRM for notification. See Telephone encounter for: 06/10/18. [facility] calling regarding refill on bisoprolol-hydrochlorothiazide [facility]) 10-6.25 MG tablet, they would like to verify drug Ref # [id number]. Would like patient to be notified as well

## 2018-06-11 NOTE — Telephone Encounter (Signed)
Rx sent to [facility] and patient has been notified

## 2018-06-18 ENCOUNTER — Other Ambulatory Visit: Payer: Self-pay

## 2018-06-21 ENCOUNTER — Other Ambulatory Visit: Payer: Self-pay

## 2018-06-21 ENCOUNTER — Telehealth: Payer: Self-pay | Admitting: Internal Medicine

## 2018-06-21 ENCOUNTER — Ambulatory Visit: Payer: BLUE CROSS/BLUE SHIELD | Admitting: Internal Medicine

## 2018-06-21 MED ORDER — LISINOPRIL-HYDROCHLOROTHIAZIDE 10-12.5 MG PO TABS: 1.0000 | ORAL_TABLET | Freq: Every day | ORAL | 1 refills | Status: DC

## 2018-06-21 NOTE — Telephone Encounter (Signed)
Copied from [place] [id number]. Topic: Quick Communication - Rx Refill/Question >> Jun 21, 2018  9:26 AM Margot Ables wrote: Medication: lisinopril-hydrochlorothiazide (PRINZIDE,ZESTORETIC) 10-12.5 MG tablet  Pt states that [facility] has sent the bisoprolol-hctz but they either need clarification or new RX to dispense on lisinopril-hctz. Pt has only 2 lisinopril left. Pt is stating he has gotten different information from [facility]. Once he was told they need clarification. Once he was told no RX was received. Pt provided ph # [phone] for [facility] Pharmacist. Pt states he's been trying to get this taken care of for 2 weeks. Please call pharmacy and advise pt.

## 2018-06-21 NOTE — Telephone Encounter (Signed)
rx resent to pharmacy. Pharmacy stated they did not have updated script.

## 2018-06-24 ENCOUNTER — Ambulatory Visit (INDEPENDENT_AMBULATORY_CARE_PROVIDER_SITE_OTHER): Payer: Medicare Other | Admitting: Internal Medicine

## 2018-06-24 ENCOUNTER — Encounter: Payer: Self-pay | Admitting: Internal Medicine

## 2018-06-24 DIAGNOSIS — E7800 Pure hypercholesterolemia, unspecified: Secondary | ICD-10-CM

## 2018-06-24 DIAGNOSIS — M25561 Pain in right knee: Secondary | ICD-10-CM | POA: Diagnosis not present

## 2018-06-24 DIAGNOSIS — E119 Type 2 diabetes mellitus without complications: Secondary | ICD-10-CM

## 2018-06-24 DIAGNOSIS — R945 Abnormal results of liver function studies: Secondary | ICD-10-CM

## 2018-06-24 DIAGNOSIS — I10 Essential (primary) hypertension: Secondary | ICD-10-CM | POA: Diagnosis not present

## 2018-06-24 DIAGNOSIS — R7989 Other specified abnormal findings of blood chemistry: Secondary | ICD-10-CM

## 2018-06-24 MED ORDER — BISOPROLOL-HYDROCHLOROTHIAZIDE 10-6.25 MG PO TABS: 1.0000 | ORAL_TABLET | Freq: Every day | ORAL | 0 refills | Status: DC

## 2018-06-24 NOTE — Progress Notes (Signed)
Patient ID: Jamie Martin, male   DOB: 06/02/1951, 67 y.o.   MRN: [id number]   Subjective:    Patient ID: Jamie Martin, male    DOB: 01/17/1951, 67 y.o.   MRN: [id number]  HPI  Patient here for a scheduled follow up.  Has been seeing Dr Kernodle for his knee.  S/p injection.  Notices more when going up and down stairs.  Desires no further intervention.  Also seeing Dr Troxler for toe pain.  On meloxicam previously.  Overall he feels he is doing well.  Not walking as much.  Discussed diet and exercise.  No chest pain.  Breathing stable. No sob.  No acid reflux.  No abdominal pain.  Bowels moving.     Past Medical History:  Diagnosis Date  . Chicken pox   . History of hyperglycemia   . Hypertension   . Plantar fasciitis    History of   Past Surgical History:  Procedure Laterality Date  . arthroscopic knee     x2- Performed by Dr. by Harold Kernodle  . FOOT SURGERY  2009  . KNEE ARTHROSCOPY  1985,1994   right knee  . PLANTAR FASCIECTOMY  2012  . TONSILLECTOMY    . TONSILLECTOMY AND ADENOIDECTOMY     Family History  Problem Relation Age of Onset  . Hypertension Mother   . Arthritis Mother   . Stroke Father   . Colon cancer Neg Hx   . Prostate cancer Neg Hx    Social History   Socioeconomic History  . Marital status: Married    Spouse name: Not on file  . Number of children: Not on file  . Years of education: Not on file  . Highest education level: Not on file  Occupational History  . Not on file  Social Needs  . Financial resource strain: Not on file  . Food insecurity:    Worry: Not on file    Inability: Not on file  . Transportation needs:    Medical: Not on file    Non-medical: Not on file  Tobacco Use  . Smoking status: Former Smoker    Last attempt to quit: 11/17/2005    Years since quitting: 12.6  . Smokeless tobacco: Never Used  Substance and Sexual Activity  . Alcohol use: Yes    Alcohol/week: 0.0 standard drinks  . Drug use: No  . Sexual activity:  Not on file  Lifestyle  . Physical activity:    Days per week: Not on file    Minutes per session: Not on file  . Stress: Not on file  Relationships  . Social connections:    Talks on phone: Not on file    Gets together: Not on file    Attends religious service: Not on file    Active member of club or organization: Not on file    Attends meetings of clubs or organizations: Not on file    Relationship status: Not on file  Other Topics Concern  . Not on file  Social History Narrative   Married and has 2 children.    Outpatient Encounter Medications as of 06/24/2018  Medication Sig  . Aspirin 81 MG EC tablet Take 81 mg by mouth daily.  . bisoprolol-hydrochlorothiazide (ZIAC) 10-6.25 MG tablet Take 1 tablet by mouth daily. Will call if needed.  . lisinopril-hydrochlorothiazide (PRINZIDE,ZESTORETIC) 10-12.5 MG tablet Take 1 tablet by mouth daily.  . OMEGA 3 1000 MG CAPS Take by mouth 2 (two) times daily.  .   tadalafil (CIALIS) 5 MG tablet Take 1 tablet (5 mg total) by mouth daily as needed for erectile dysfunction.  . [DISCONTINUED] bisoprolol-hydrochlorothiazide (ZIAC) 10-6.25 MG tablet Take 1 tablet by mouth daily.   No facility-administered encounter medications on file as of 06/24/2018.     Review of Systems  Constitutional: Negative for appetite change and unexpected weight change.  HENT: Negative for congestion and sinus pressure.   Respiratory: Negative for cough, chest tightness and shortness of breath.   Cardiovascular: Negative for chest pain, palpitations and leg swelling.  Gastrointestinal: Negative for abdominal pain, diarrhea, nausea and vomiting.  Genitourinary: Negative for difficulty urinating and dysuria.  Musculoskeletal: Negative for myalgias.       Knee pain as outlined.    Skin: Negative for color change and rash.  Neurological: Negative for dizziness, light-headedness and headaches.  Psychiatric/Behavioral: Negative for agitation and dysphoric mood.         Objective:    Physical Exam  Constitutional: He appears well-developed and well-nourished. No distress.  HENT:  Nose: Nose normal.  Mouth/Throat: Oropharynx is clear and moist.  Neck: Neck supple.  Cardiovascular: Normal rate and regular rhythm.  Pulmonary/Chest: Effort normal and breath sounds normal. No respiratory distress.  Abdominal: Soft. Bowel sounds are normal. There is no tenderness.  Musculoskeletal: He exhibits no edema or tenderness.  Lymphadenopathy:    He has no cervical adenopathy.  Skin: No rash noted. No erythema.  Psychiatric: He has a normal mood and affect. His behavior is normal.    BP 122/70 (BP Location: Left Arm, Patient Position: Sitting, Cuff Size: Large)   Pulse 70   Temp (!) 97.5 F (36.4 C) (Oral)   Resp 16   Wt 246 lb 3.2 oz (111.7 kg)   SpO2 96%   BMI 37.31 kg/m  Wt Readings from Last 3 Encounters:  06/24/18 246 lb 3.2 oz (111.7 kg)  02/26/18 244 lb 6.4 oz (110.9 kg)  09/24/17 245 lb 9.6 oz (111.4 kg)     Lab Results  Component Value Date   WBC 6.7 09/24/2017   HGB 16.0 09/24/2017   HCT 47.1 09/24/2017   PLT 214.0 09/24/2017   GLUCOSE 162 (H) 09/24/2017   CHOL 166 09/24/2017   TRIG 126.0 09/24/2017   HDL 43.00 09/24/2017   LDLDIRECT 103.9 05/03/2013   LDLCALC 98 09/24/2017   ALT 47 09/24/2017   AST 33 09/24/2017   NA 137 09/24/2017   K 4.1 09/24/2017   CL 102 09/24/2017   CREATININE 1.11 09/24/2017   BUN 19 09/24/2017   CO2 27 09/24/2017   TSH 1.15 09/24/2017   PSA 2.26 09/24/2017   INR 1.0 04/25/2011   HGBA1C 6.4 09/24/2017   MICROALBUR <0.7 01/16/2016       Assessment & Plan:   Problem List Items Addressed This Visit    Abnormal liver function tests    Follow liver panel.        Diabetes mellitus ([facility])    Low carb diet and exercise.  Follow met b and a1c.        Relevant Medications   bisoprolol-hydrochlorothiazide (ZIAC) 10-6.25 MG tablet   Other Relevant Orders   Hemoglobin A1c   Basic metabolic panel    Microalbumin / creatinine urine ratio   Hyperbilirubinemia    Follow liver panel.       Hypercholesterolemia    Low cholesterol diet and exercise.  Follow lipid panel.        Relevant Medications   bisoprolol-hydrochlorothiazide (ZIAC) 10-6.25 MG   tablet   Other Relevant Orders   Hepatic function panel   Lipid panel   Hypertension    Blood pressure under good control.  Continue same medication regimen.  Follow pressures.  Follow metabolic panel.        Relevant Medications   bisoprolol-hydrochlorothiazide (ZIAC) 10-6.25 MG tablet   Knee pain    Saw ortho.  S/p injection.  Desires no further intervention.  Follow.            SCOTT, CHARLENE, MD  

## 2018-06-27 ENCOUNTER — Encounter: Payer: Self-pay | Admitting: Internal Medicine

## 2018-06-27 DIAGNOSIS — M25569 Pain in unspecified knee: Secondary | ICD-10-CM | POA: Insufficient documentation

## 2018-06-27 NOTE — Assessment & Plan Note (Signed)
Low cholesterol diet and exercise.  Follow lipid panel.   

## 2018-06-27 NOTE — Assessment & Plan Note (Signed)
Blood pressure under good control.  Continue same medication regimen.  Follow pressures.  Follow metabolic panel.   

## 2018-06-27 NOTE — Assessment & Plan Note (Signed)
Low carb diet and exercise.  Follow met b and a1c.   

## 2018-06-27 NOTE — Assessment & Plan Note (Signed)
Saw ortho.  S/p injection.  Desires no further intervention.  Follow.

## 2018-06-27 NOTE — Assessment & Plan Note (Signed)
Follow liver panel.  

## 2018-07-01 DIAGNOSIS — H2513 Age-related nuclear cataract, bilateral: Secondary | ICD-10-CM | POA: Diagnosis not present

## 2018-07-07 ENCOUNTER — Other Ambulatory Visit (INDEPENDENT_AMBULATORY_CARE_PROVIDER_SITE_OTHER): Payer: Medicare Other

## 2018-07-07 DIAGNOSIS — E119 Type 2 diabetes mellitus without complications: Secondary | ICD-10-CM | POA: Diagnosis not present

## 2018-07-07 DIAGNOSIS — E7800 Pure hypercholesterolemia, unspecified: Secondary | ICD-10-CM | POA: Diagnosis not present

## 2018-07-07 LAB — MICROALBUMIN / CREATININE URINE RATIO
Creatinine,U: 140.4 mg/dL
Microalb Creat Ratio: 0.5 mg/g (ref 0.0–30.0)
Microalb, Ur: <0.7 mg/dL (ref 0.0–1.9)

## 2018-07-07 LAB — BASIC METABOLIC PANEL
BUN: 17 mg/dL (ref 6–23)
CHLORIDE: 102 meq/L (ref 96–112)
CO2: 22 meq/L (ref 19–32)
CREATININE: 1.16 mg/dL (ref 0.40–1.50)
Calcium: 9.7 mg/dL (ref 8.4–10.5)
GFR: 66.94 mL/min (ref >60.00)
Glucose, Bld: 158 mg/dL — ABNORMAL HIGH (ref 70–99)
Potassium: 3.5 mEq/L (ref 3.5–5.1)
Sodium: 136 mEq/L (ref 135–145)

## 2018-07-07 LAB — HEPATIC FUNCTION PANEL
ALT: 43 U/L (ref 0–53)
AST: 29 U/L (ref 0–37)
Albumin: 4.4 g/dL (ref 3.5–5.2)
Alkaline Phosphatase: 63 U/L (ref 39–117)
Bilirubin, Direct: 0.2 mg/dL (ref 0.0–0.3)
Total Bilirubin: 1.6 mg/dL — ABNORMAL HIGH (ref 0.2–1.2)
Total Protein: 7.2 g/dL (ref 6.0–8.3)

## 2018-07-07 LAB — HEMOGLOBIN A1C: Hgb A1c MFr Bld: 6.6 % — ABNORMAL HIGH (ref 4.6–6.5)

## 2018-07-07 LAB — LIPID PANEL
CHOL/HDL RATIO: 5
Cholesterol: 154 mg/dL (ref 0–200)
HDL: 32.3 mg/dL — ABNORMAL LOW (ref >39.00)
LDL CALC: 90 mg/dL (ref 0–99)
NonHDL: 121.74
Triglycerides: 159 mg/dL — ABNORMAL HIGH (ref 0.0–149.0)
VLDL: 31.8 mg/dL (ref 0.0–40.0)

## 2018-09-02 DIAGNOSIS — Z23 Encounter for immunization: Secondary | ICD-10-CM | POA: Diagnosis not present

## 2018-12-17 ENCOUNTER — Other Ambulatory Visit: Payer: Self-pay

## 2018-12-17 ENCOUNTER — Other Ambulatory Visit: Payer: Self-pay | Admitting: Internal Medicine

## 2018-12-17 ENCOUNTER — Encounter: Payer: Self-pay | Admitting: Internal Medicine

## 2018-12-17 MED ORDER — BISOPROLOL-HYDROCHLOROTHIAZIDE 10-6.25 MG PO TABS: 1.0000 | ORAL_TABLET | Freq: Every day | ORAL | 0 refills | Status: DC

## 2018-12-17 MED ORDER — LISINOPRIL-HYDROCHLOROTHIAZIDE 10-12.5 MG PO TABS: 1.0000 | ORAL_TABLET | Freq: Every day | ORAL | 1 refills | Status: DC

## 2018-12-24 ENCOUNTER — Ambulatory Visit (INDEPENDENT_AMBULATORY_CARE_PROVIDER_SITE_OTHER): Payer: Medicare Other | Admitting: Internal Medicine

## 2018-12-24 ENCOUNTER — Encounter: Payer: Self-pay | Admitting: Internal Medicine

## 2018-12-24 VITALS — BP 136/78 | HR 57 | Temp 98.1°F | Resp 16 | Wt 245.0 lb

## 2018-12-24 DIAGNOSIS — E119 Type 2 diabetes mellitus without complications: Secondary | ICD-10-CM | POA: Diagnosis not present

## 2018-12-24 DIAGNOSIS — R945 Abnormal results of liver function studies: Secondary | ICD-10-CM

## 2018-12-24 DIAGNOSIS — Z125 Encounter for screening for malignant neoplasm of prostate: Secondary | ICD-10-CM

## 2018-12-24 DIAGNOSIS — I10 Essential (primary) hypertension: Secondary | ICD-10-CM

## 2018-12-24 DIAGNOSIS — E7800 Pure hypercholesterolemia, unspecified: Secondary | ICD-10-CM

## 2018-12-24 DIAGNOSIS — Z23 Encounter for immunization: Secondary | ICD-10-CM | POA: Diagnosis not present

## 2018-12-24 DIAGNOSIS — R7989 Other specified abnormal findings of blood chemistry: Secondary | ICD-10-CM

## 2018-12-24 LAB — CBC WITH DIFFERENTIAL/PLATELET
BASOS ABS: 0 10*3/uL (ref 0.0–0.1)
BASOS PCT: 0.5 % (ref 0.0–3.0)
EOS ABS: 0.4 10*3/uL (ref 0.0–0.7)
Eosinophils Relative: 5.3 % — ABNORMAL HIGH (ref 0.0–5.0)
HEMATOCRIT: 46.1 % (ref 39.0–52.0)
HEMOGLOBIN: 15.9 g/dL (ref 13.0–17.0)
LYMPHS PCT: 32.7 % (ref 12.0–46.0)
Lymphs Abs: 2.8 10*3/uL (ref 0.7–4.0)
MCHC: 34.4 g/dL (ref 30.0–36.0)
MCV: 91.1 fl (ref 78.0–100.0)
Monocytes Absolute: 0.8 10*3/uL (ref 0.1–1.0)
Monocytes Relative: 9.1 % (ref 3.0–12.0)
Neutro Abs: 4.5 10*3/uL (ref 1.4–7.7)
Neutrophils Relative %: 52.4 % (ref 43.0–77.0)
Platelets: 234 10*3/uL (ref 150.0–400.0)
RBC: 5.06 Mil/uL (ref 4.22–5.81)
RDW: 12.8 % (ref 11.5–15.5)
WBC: 8.5 10*3/uL (ref 4.0–10.5)

## 2018-12-24 LAB — BASIC METABOLIC PANEL
BUN: 23 mg/dL (ref 6–23)
CO2: 22 mEq/L (ref 19–32)
Calcium: 9.5 mg/dL (ref 8.4–10.5)
Chloride: 103 mEq/L (ref 96–112)
Creatinine, Ser: 1.19 mg/dL (ref 0.40–1.50)
GFR: 61.06 mL/min (ref >60.00)
Glucose, Bld: 169 mg/dL — ABNORMAL HIGH (ref 70–99)
Potassium: 4.1 mEq/L (ref 3.5–5.1)
Sodium: 139 mEq/L (ref 135–145)

## 2018-12-24 LAB — HEPATIC FUNCTION PANEL
ALT: 39 U/L (ref 0–53)
AST: 31 U/L (ref 0–37)
Albumin: 4.6 g/dL (ref 3.5–5.2)
Alkaline Phosphatase: 70 U/L (ref 39–117)
BILIRUBIN DIRECT: 0.2 mg/dL (ref 0.0–0.3)
TOTAL PROTEIN: 7 g/dL (ref 6.0–8.3)
Total Bilirubin: 1.3 mg/dL — ABNORMAL HIGH (ref 0.2–1.2)

## 2018-12-24 LAB — LIPID PANEL
CHOL/HDL RATIO: 5
Cholesterol: 172 mg/dL (ref 0–200)
HDL: 35.1 mg/dL — ABNORMAL LOW (ref >39.00)
LDL Cholesterol: 99 mg/dL (ref 0–99)
NONHDL: 136.63
TRIGLYCERIDES: 189 mg/dL — AB (ref 0.0–149.0)
VLDL: 37.8 mg/dL (ref 0.0–40.0)

## 2018-12-24 LAB — TSH: TSH: 1.32 u[IU]/mL (ref 0.35–4.50)

## 2018-12-24 LAB — PSA, MEDICARE: PSA: 2.16 ng/mL (ref 0.10–4.00)

## 2018-12-24 LAB — HEMOGLOBIN A1C: Hgb A1c MFr Bld: 6.9 % — ABNORMAL HIGH (ref 4.6–6.5)

## 2018-12-24 MED ORDER — LISINOPRIL-HYDROCHLOROTHIAZIDE 20-12.5 MG PO TABS: 1.0000 | ORAL_TABLET | Freq: Every day | ORAL | 1 refills | Status: DC

## 2018-12-24 NOTE — Progress Notes (Signed)
Patient ID: Jamie Martin, male   DOB: [birth year], 68 y.o.   MRN: [id number]   Subjective:    Patient ID: Jamie Martin, male    DOB: 25-Jun-1951, 68 y.o.   MRN: [id number]  HPI  Patient here for a scheduled follow up.  States he is doing relatively well.  Tries to stay active.  No chest pain.  No sob.  No acid reflux.  No abdominal pain.  Bowels moving.  No urine change.  Taking his medication regularly.  Discussed diet and exercise.     Past Medical History:  Diagnosis Date  . Chicken pox   . History of hyperglycemia   . Hypertension   . Plantar fasciitis    History of   Past Surgical History:  Procedure Laterality Date  . arthroscopic knee     x2- Performed by Dr. by Leanor Kail  . FOOT SURGERY  2009  . KNEE ARTHROSCOPY  [id number]   right knee  . PLANTAR FASCIECTOMY  2012  . TONSILLECTOMY    . TONSILLECTOMY AND ADENOIDECTOMY     Family History  Problem Relation Age of Onset  . Hypertension Mother   . Arthritis Mother   . Stroke Father   . Colon cancer Neg Hx   . Prostate cancer Neg Hx    Social History   Socioeconomic History  . Marital status: Married    Spouse name: Not on file  . Number of children: Not on file  . Years of education: Not on file  . Highest education level: Not on file  Occupational History  . Not on file  Social Needs  . Financial resource strain: Not on file  . Food insecurity:    Worry: Not on file    Inability: Not on file  . Transportation needs:    Medical: Not on file    Non-medical: Not on file  Tobacco Use  . Smoking status: Former Smoker    Last attempt to quit: 11/17/2005    Years since quitting: 13.1  . Smokeless tobacco: Never Used  Substance and Sexual Activity  . Alcohol use: Yes    Alcohol/week: 0.0 standard drinks  . Drug use: No  . Sexual activity: Not on file  Lifestyle  . Physical activity:    Days per week: Not on file    Minutes per session: Not on file  . Stress: Not on file  Relationships  . Social  connections:    Talks on phone: Not on file    Gets together: Not on file    Attends religious service: Not on file    Active member of club or organization: Not on file    Attends meetings of clubs or organizations: Not on file    Relationship status: Not on file  Other Topics Concern  . Not on file  Social History Narrative   Married and has 2 children.    Outpatient Encounter Medications as of 12/24/2018  Medication Sig  . Aspirin 81 MG EC tablet Take 81 mg by mouth daily.  . bisoprolol-hydrochlorothiazide (ZIAC) 10-6.25 MG tablet TAKE 1 TABLET DAILY  . OMEGA 3 1000 MG CAPS Take by mouth 2 (two) times daily.  . tadalafil (CIALIS) 5 MG tablet Take 1 tablet (5 mg total) by mouth daily as needed for erectile dysfunction.  . [DISCONTINUED] lisinopril-hydrochlorothiazide (PRINZIDE,ZESTORETIC) 10-12.5 MG tablet TAKE 1 TABLET DAILY  . lisinopril-hydrochlorothiazide (ZESTORETIC) 20-12.5 MG tablet Take 1 tablet by mouth daily.  . [DISCONTINUED] bisoprolol-hydrochlorothiazide [facility]) 10-6.25  MG tablet Take 1 tablet by mouth daily. Will call if needed.   No facility-administered encounter medications on file as of 12/24/2018.     Review of Systems  Constitutional: Negative for appetite change and unexpected weight change.  HENT: Negative for congestion and sinus pressure.   Respiratory: Negative for cough, chest tightness and shortness of breath.   Cardiovascular: Negative for chest pain, palpitations and leg swelling.  Gastrointestinal: Negative for abdominal pain, diarrhea, nausea and vomiting.  Genitourinary: Negative for difficulty urinating and dysuria.  Musculoskeletal: Negative for joint swelling and myalgias.  Skin: Negative for color change and rash.  Neurological: Negative for dizziness, light-headedness and headaches.  Psychiatric/Behavioral: Negative for agitation and dysphoric mood.       Objective:    Physical Exam Constitutional:      General: He is not in acute  distress.    Appearance: Normal appearance. He is well-developed.  HENT:     Nose: Nose normal. No congestion.     Mouth/Throat:     Pharynx: No oropharyngeal exudate or posterior oropharyngeal erythema.  Neck:     Musculoskeletal: Neck supple. No muscular tenderness.  Cardiovascular:     Rate and Rhythm: Normal rate and regular rhythm.  Pulmonary:     Effort: Pulmonary effort is normal. No respiratory distress.     Breath sounds: Normal breath sounds.  Abdominal:     General: Bowel sounds are normal.     Palpations: Abdomen is soft.     Tenderness: There is no abdominal tenderness.  Musculoskeletal:        General: No swelling or tenderness.  Skin:    Findings: No erythema or rash.  Neurological:     Mental Status: He is alert.  Psychiatric:        Mood and Affect: Mood normal.        Behavior: Behavior normal.     BP 136/78 (BP Location: Left Arm, Patient Position: Sitting, Cuff Size: Normal)   Pulse (!) 57   Temp 98.1 F (36.7 C) (Oral)   Resp 16   Wt 245 lb (111.1 kg)   SpO2 97%   BMI 37.13 kg/m  Wt Readings from Last 3 Encounters:  12/24/18 245 lb (111.1 kg)  06/24/18 246 lb 3.2 oz (111.7 kg)  02/26/18 244 lb 6.4 oz (110.9 kg)     Lab Results  Component Value Date   WBC 8.5 12/24/2018   HGB 15.9 12/24/2018   HCT 46.1 12/24/2018   PLT 234.0 12/24/2018   GLUCOSE 169 (H) 12/24/2018   CHOL 172 12/24/2018   TRIG 189.0 (H) 12/24/2018   HDL 35.10 (L) 12/24/2018   LDLDIRECT 103.9 05/03/2013   LDLCALC 99 12/24/2018   ALT 39 12/24/2018   AST 31 12/24/2018   NA 139 12/24/2018   K 4.1 12/24/2018   CL 103 12/24/2018   CREATININE 1.19 12/24/2018   BUN 23 12/24/2018   CO2 22 12/24/2018   TSH 1.32 12/24/2018   PSA 2.16 12/24/2018   INR 1.0 04/25/2011   HGBA1C 6.9 (H) 12/24/2018   MICROALBUR <0.7 07/07/2018       Assessment & Plan:   Problem List Items Addressed This Visit    Abnormal liver function tests   Diabetes mellitus ([place]) - Primary    Low  carb diet and exercise.  Follow met b and a1c.        Relevant Medications   lisinopril-hydrochlorothiazide (ZESTORETIC) 20-12.5 MG tablet   Other Relevant Orders   Hemoglobin A1c (Completed)  Hyperbilirubinemia    Follow liver panel.        Hypercholesterolemia    Low cholesterol diet and exercise.  Follow lipid panel.       Relevant Medications   lisinopril-hydrochlorothiazide (ZESTORETIC) 20-12.5 MG tablet   Other Relevant Orders   Hepatic function panel (Completed)   Lipid panel (Completed)   Hypertension    Blood pressure 142-144/82.  Increased above goal.  Increase lisinopril/hctz to 20/12.5.  Follow pressures.  Get him back in soon to reassess.  Follow metabolic panel.        Relevant Medications   lisinopril-hydrochlorothiazide (ZESTORETIC) 20-12.5 MG tablet   Other Relevant Orders   TSH (Completed)   CBC with Differential/Platelet (Completed)   Basic metabolic panel (Completed)    Other Visit Diagnoses    Prostate cancer screening       Relevant Orders   PSA, Medicare (Completed)   Need for 23-polyvalent pneumococcal polysaccharide vaccine       Relevant Orders   Pneumococcal polysaccharide vaccine 23-valent greater than or equal to 2yo subcutaneous/IM (Completed)       Einar Pheasant, MD

## 2019-01-02 ENCOUNTER — Encounter: Payer: Self-pay | Admitting: Internal Medicine

## 2019-01-02 NOTE — Assessment & Plan Note (Signed)
Low cholesterol diet and exercise.  Follow lipid panel.   

## 2019-01-02 NOTE — Assessment & Plan Note (Signed)
Blood pressure 142-144/82.  Increased above goal.  Increase lisinopril/hctz to 20/12.5.  Follow pressures.  Get him back in soon to reassess.  Follow metabolic panel.

## 2019-01-02 NOTE — Assessment & Plan Note (Signed)
Low carb diet and exercise.  Follow met b and a1c.   

## 2019-01-02 NOTE — Assessment & Plan Note (Signed)
Follow liver panel.  

## 2019-03-24 ENCOUNTER — Ambulatory Visit: Payer: Medicare Other | Admitting: Internal Medicine

## 2019-04-01 DIAGNOSIS — M1711 Unilateral primary osteoarthritis, right knee: Secondary | ICD-10-CM | POA: Diagnosis not present

## 2019-04-05 ENCOUNTER — Ambulatory Visit (INDEPENDENT_AMBULATORY_CARE_PROVIDER_SITE_OTHER): Payer: Medicare Other | Admitting: Internal Medicine

## 2019-04-05 ENCOUNTER — Encounter: Payer: Self-pay | Admitting: Internal Medicine

## 2019-04-05 DIAGNOSIS — R945 Abnormal results of liver function studies: Secondary | ICD-10-CM | POA: Diagnosis not present

## 2019-04-05 DIAGNOSIS — I10 Essential (primary) hypertension: Secondary | ICD-10-CM

## 2019-04-05 DIAGNOSIS — R7989 Other specified abnormal findings of blood chemistry: Secondary | ICD-10-CM

## 2019-04-05 DIAGNOSIS — E119 Type 2 diabetes mellitus without complications: Secondary | ICD-10-CM | POA: Diagnosis not present

## 2019-04-05 DIAGNOSIS — E7800 Pure hypercholesterolemia, unspecified: Secondary | ICD-10-CM

## 2019-04-05 DIAGNOSIS — M25561 Pain in right knee: Secondary | ICD-10-CM | POA: Diagnosis not present

## 2019-04-05 MED ORDER — BISOPROLOL-HYDROCHLOROTHIAZIDE 10-6.25 MG PO TABS: 1.0000 | ORAL_TABLET | Freq: Every day | ORAL | 1 refills | Status: DC

## 2019-04-05 MED ORDER — LISINOPRIL-HYDROCHLOROTHIAZIDE 10-12.5 MG PO TABS: 1.0000 | ORAL_TABLET | Freq: Every day | ORAL | 1 refills | Status: DC

## 2019-04-05 NOTE — Progress Notes (Signed)
Patient ID: Jamie Martin, male   DOB: [birth year], 69 y.o.   MRN: [id number]   Virtual Visit via video Note  This visit type was conducted due to national recommendations for restrictions regarding the COVID-19 pandemic (e.g. social distancing).  This format is felt to be most appropriate for this patient at this time.  All issues noted in this document were discussed and addressed.  No physical exam was performed (except for noted visual exam findings with Video Visits).   I connected with Jamie Martin by a video enabled telemedicine application and verified that I am speaking with the correct person using two identifiers. Location patient: home Location provider: work  Persons participating in the virtual visit: patient, provider  I discussed the limitations, risks, security and privacy concerns of performing an evaluation and management service by video and the availability of in person appointments. The patient expressed understanding and agreed to proceed.   Reason for visit: scheduled follow up.    HPI: He reports he is doing well.  Feels good.  Trying to stay active.  No chest pain.  No sob.  No acid reflux.  No abdominal pain.  Bowels moving.  Main complaint is that of right knee pain.  Saw ortho - Dr HBK.  S/p injjection. Planning for f/u in mid June for synvisc.  Riding a bike.  Last visit, blood pressure elevated.  He was to increase lisinopril/hctz to 20/12.5.  He did not increase the dose.  Remained on 10/12.5 q day.  Blood pressures averaging 110-130/72.  Discussed diet and exercise.  Weight has decreased since last visit.  Weight now 234 pounds.     ROS: See pertinent positives and negatives per HPI.  Past Medical History:  Diagnosis Date  . Chicken pox   . History of hyperglycemia   . Hypertension   . Plantar fasciitis    History of    Past Surgical History:  Procedure Laterality Date  . arthroscopic knee     x2- Performed by Dr. by Leanor Kail  . FOOT SURGERY   2009  . KNEE ARTHROSCOPY  [id number]   right knee  . PLANTAR FASCIECTOMY  2012  . TONSILLECTOMY    . TONSILLECTOMY AND ADENOIDECTOMY      Family History  Problem Relation Age of Onset  . Hypertension Mother   . Arthritis Mother   . Stroke Father   . Colon cancer Neg Hx   . Prostate cancer Neg Hx     SOCIAL HX: reviewed.    Current Outpatient Medications:  .  Aspirin 81 MG EC tablet, Take 81 mg by mouth daily., Disp: , Rfl:  .  bisoprolol-hydrochlorothiazide (ZIAC) 10-6.25 MG tablet, Take 1 tablet by mouth daily., Disp: 90 tablet, Rfl: 1 .  lisinopril-hydrochlorothiazide (ZESTORETIC) 10-12.5 MG tablet, Take 1 tablet by mouth daily., Disp: 90 tablet, Rfl: 1 .  OMEGA 3 1000 MG CAPS, Take by mouth 2 (two) times daily., Disp: , Rfl:  .  tadalafil (CIALIS) 5 MG tablet, Take 1 tablet (5 mg total) by mouth daily as needed for erectile dysfunction., Disp: 30 tablet, Rfl: 0  EXAM:  VITALS per patient if applicable:  Weight 175 pounds, blood pressure 132/84  GENERAL: alert, oriented, appears well and in no acute distress  HEENT: atraumatic, conjunttiva clear, no obvious abnormalities on inspection of external nose and ears  NECK: normal movements of the head and neck  LUNGS: on inspection no signs of respiratory distress, breathing rate appears normal, no obvious gross  SOB, gasping or wheezing  CV: no obvious cyanosis  PSYCH/NEURO: pleasant and cooperative, no obvious depression or anxiety, speech and thought processing grossly intact  ASSESSMENT AND PLAN:  Discussed the following assessment and plan:  Abnormal liver function tests  Type 2 diabetes mellitus without complication, without long-term current use of insulin (HCC) - Plan: Hemoglobin V0Y, Basic metabolic panel, Microalbumin / creatinine urine ratio  Hypercholesterolemia - Plan: Hepatic function panel, Lipid panel  Essential hypertension  Right knee pain, unspecified chronicity  Abnormal liver function tests  Follow liver panel.   Diabetes mellitus Low carb diet and exercise.  Follow met b and a1c.    Hypercholesterolemia Low cholesterol diet and exercise.  Follow lipid panel.    Hypertension Never increased the lisinopril/hctz.  Blood pressure as outlined.  Continue current medication regimen.  Follow pressures.  Follow metabolic panel.    Knee pain Saw ortho.  S/p injection.  Planning to f/u in mid June for synvisc.      I discussed the assessment and treatment plan with the patient. The patient was provided an opportunity to ask questions and all were answered. The patient agreed with the plan and demonstrated an understanding of the instructions.   The patient was advised to call back or seek an in-person evaluation if the symptoms worsen or if the condition fails to improve as anticipated.    Einar Pheasant, MD

## 2019-04-09 ENCOUNTER — Encounter: Payer: Self-pay | Admitting: Internal Medicine

## 2019-04-09 NOTE — Assessment & Plan Note (Signed)
Follow liver panel.  

## 2019-04-09 NOTE — Assessment & Plan Note (Signed)
Never increased the lisinopril/hctz.  Blood pressure as outlined.  Continue current medication regimen.  Follow pressures.  Follow metabolic panel.

## 2019-04-09 NOTE — Assessment & Plan Note (Signed)
Saw ortho.  S/p injection.  Planning to f/u in mid June for synvisc.

## 2019-04-09 NOTE — Assessment & Plan Note (Signed)
Low cholesterol diet and exercise.  Follow lipid panel.   

## 2019-04-09 NOTE — Assessment & Plan Note (Signed)
Low carb diet and exercise.  Follow met b and a1c.   

## 2019-05-11 DIAGNOSIS — M1711 Unilateral primary osteoarthritis, right knee: Secondary | ICD-10-CM | POA: Diagnosis not present

## 2019-05-11 DIAGNOSIS — L578 Other skin changes due to chronic exposure to nonionizing radiation: Secondary | ICD-10-CM | POA: Diagnosis not present

## 2019-06-06 ENCOUNTER — Other Ambulatory Visit: Payer: Self-pay

## 2019-06-06 ENCOUNTER — Ambulatory Visit (INDEPENDENT_AMBULATORY_CARE_PROVIDER_SITE_OTHER): Payer: Medicare Other

## 2019-06-06 DIAGNOSIS — Z0000 Encounter for general adult medical examination without abnormal findings: Secondary | ICD-10-CM | POA: Diagnosis not present

## 2019-06-06 DIAGNOSIS — Z1159 Encounter for screening for other viral diseases: Secondary | ICD-10-CM | POA: Diagnosis not present

## 2019-06-06 NOTE — Progress Notes (Signed)
Subjective:   Jamie Martin is a 68 y.o. male who presents for an Initial Medicare Annual Wellness Visit.  Review of Systems  No ROS.  Medicare Wellness Virtual Visit.  Visual/audio telehealth visit, UTA vital signs.   See social history for additional risk factors.   Cardiac Risk Factors include: advanced age (>81men, >33 women);male gender;hypertension;diabetes mellitus    Objective:    Today's Vitals   There is no height or weight on file to calculate BMI.  Advanced Directives 06/06/2019  Does Patient Have a Medical Advance Directive? Yes  Type of Paramedic of [place];Living will  Does patient want to make changes to medical advance directive? No - Patient declined  Copy of [place] in Chart? No - copy requested    Current Medications (verified) Outpatient Encounter Medications as of 06/06/2019  Medication Sig  . Aspirin 81 MG EC tablet Take 81 mg by mouth daily.  . bisoprolol-hydrochlorothiazide (ZIAC) 10-6.25 MG tablet Take 1 tablet by mouth daily.  Marland Kitchen lisinopril-hydrochlorothiazide (ZESTORETIC) 10-12.5 MG tablet Take 1 tablet by mouth daily.  . OMEGA 3 1000 MG CAPS Take by mouth 2 (two) times daily.  . tadalafil (CIALIS) 5 MG tablet Take 1 tablet (5 mg total) by mouth daily as needed for erectile dysfunction.   No facility-administered encounter medications on file as of 06/06/2019.     Allergies (verified) Patient has no known allergies.   History: Past Medical History:  Diagnosis Date  . Chicken pox   . History of hyperglycemia   . Hypertension   . Plantar fasciitis    History of   Past Surgical History:  Procedure Laterality Date  . arthroscopic knee     x2- Performed by Dr. by Leanor Kail  . FOOT SURGERY  2009  . KNEE ARTHROSCOPY  [id number]   right knee  . PLANTAR FASCIECTOMY  2012  . TONSILLECTOMY    . TONSILLECTOMY AND ADENOIDECTOMY     Family History  Problem Relation Age of Onset  .  Hypertension Mother   . Arthritis Mother   . Stroke Father   . Colon cancer Neg Hx   . Prostate cancer Neg Hx    Social History   Socioeconomic History  . Marital status: Married    Spouse name: Not on file  . Number of children: Not on file  . Years of education: Not on file  . Highest education level: Not on file  Occupational History  . Not on file  Social Needs  . Financial resource strain: Not hard at all  . Food insecurity    Worry: Never true    Inability: Never true  . Transportation needs    Medical: No    Non-medical: No  Tobacco Use  . Smoking status: Former Smoker    Quit date: 11/17/2005    Years since quitting: 13.5  . Smokeless tobacco: Never Used  Substance and Sexual Activity  . Alcohol use: Yes    Alcohol/week: 0.0 standard drinks    Comment: occ  . Drug use: No  . Sexual activity: Not on file  Lifestyle  . Physical activity    Days per week: 3 days    Minutes per session: 20 min  . Stress: Not at all  Relationships  . Social Herbalist on phone: Not on file    Gets together: Not on file    Attends religious service: Not on file    Active member of  club or organization: Not on file    Attends meetings of clubs or organizations: Not on file    Relationship status: Not on file  Other Topics Concern  . Not on file  Social History Narrative   Married and has 2 children.   Tobacco Counseling Counseling given: Not Answered   Clinical Intake:  Pre-visit preparation completed: Yes        Diabetes: Yes(Followed by pcp)  How often do you need to have someone help you when you read instructions, pamphlets, or other written materials from your doctor or pharmacy?: 1 - Never  Interpreter Needed?: No     Activities of Daily Living In your present state of health, do you have any difficulty performing the following activities: 06/06/2019  Hearing? N  Vision? N  Difficulty concentrating or making decisions? N  Walking or climbing  stairs? N  Dressing or bathing? N  Doing errands, shopping? N  Preparing Food and eating ? N  Using the Toilet? N  In the past six months, have you accidently leaked urine? N  Do you have problems with loss of bowel control? N  Managing your Medications? N  Managing your Finances? N  Housekeeping or managing your Housekeeping? N  Some recent data might be hidden     Immunizations and Health Maintenance Immunization History  Administered Date(s) Administered  . Influenza Split 07/05/2013, 08/03/2014  . Influenza, High Dose Seasonal PF 09/01/2018  . Influenza,inj,Quad PF,6+ Mos 07/24/2016  . Influenza-Unspecified 09/23/2011  . Pneumococcal Conjugate-13 09/24/2017  . Pneumococcal Polysaccharide-23 12/24/2018  . Td 01/29/2004   Health Maintenance Due  Topic Date Due  . Hepatitis C Screening  1951/01/29  . FOOT EXAM  06/11/1962    Patient Care Team: Einar Pheasant, MD as PCP - General (Internal Medicine)  Indicate any recent Medical Services you may have received from other than [facility] providers in the past year (date may be approximate).    Assessment:   This is a routine wellness examination for [name].  I connected with patient 06/06/19 at 12:30 PM EDT by an audio enabled telemedicine application and verified that I am speaking with the correct person using two identifiers. Patient stated full name and DOB. Patient gave permission to continue with virtual visit. Patient's location was at home and Nurse's location was at [place] office.   Health Screenings  Colonoscopy - declined Cologuard- declined Glaucoma -none Hearing -demonstrates normal hearing during visit. Hemoglobin A1C - 12/2018 (6.9) Cholesterol - 12/2018  PSA- 12/2018 Dental- UTD Vision- visits within the last 12 months. Hepatitis C screening- consent given; future lab ordered Foot exam- due; discussed. Reports no bruises, or open wounds. No numbness or tingling.    Social  Alcohol intake - yes       Smoking history- former    Smokers in home? none Illicit drug use? none Physical activity - bicycling, walking, yard work Diet - regular BMI- discussed the importance of a healthy diet, water intake and the benefits of aerobic exercise.  Educational material provided.   Safety  Patient feels safe at home- yes Patient does have smoke detectors at home- yes Patient does wear sunscreen or protective clothing when in direct sunlight -yes Patient does wear seat belt when in a moving vehicle -yes Patient drives- yes  ESLPN-30 precautions and sickness symptoms discussed.   Activities of Daily Living Patient denies needing assistance with: driving, household chores, feeding themselves, getting from bed to chair, getting to the toilet, bathing/showering, dressing, managing money, or  preparing meals.  No new identified risk were noted.    Depression Screen Patient denies losing interest in daily life, feeling hopeless, or crying easily over simple problems.   Medication-taking as directed and without issues.   Fall Screen Patient denies being afraid of falling or falling in the last year.   Memory Screen Patient is alert.  Patient denies difficulty focusing, concentrating or misplacing items. Correctly identified the president of the [place], season and recall. Patient likes to read and play computer games for brain stimulation.  Immunizations The following Immunizations were discussed: Influenza, shingles, pneumonia, and tetanus.    Other Providers Patient Care Team: Einar Pheasant, MD as PCP - General (Internal Medicine)  Hearing/Vision screen  Hearing Screening   125Hz  250Hz  500Hz  1000Hz  2000Hz  3000Hz  4000Hz  6000Hz  8000Hz   Right ear:           Left ear:           Comments: Patient is able to hear conversational tones without difficulty.  No issues reported.  Vision Screening Comments: Wears reading glasses. Visual acuity not assessed, virtual visit.  Encouraged visits to  ophthalmologist every 12 months.    Dietary issues and exercise activities discussed: Current Exercise Habits: Home exercise routine, Type of exercise: walking;stretching, Time (Minutes): 20, Frequency (Times/Week): 3, Weekly Exercise (Minutes/Week): 60, Intensity: Mild  Goals    . Follow up with Primary Care Provider     Keep all routine maintenance appointments.      Depression Screen PHQ 2/9 Scores 06/06/2019 02/26/2018 03/26/2017 07/24/2016  PHQ - 2 Score 0 0 0 0  PHQ- 9 Score - 0 0 -    Fall Risk Fall Risk  06/06/2019 03/26/2017 07/24/2016  Falls in the past year? 0 No No   Cognitive Function:     6CIT Screen 06/06/2019  What Year? 0 points  What month? 0 points  What time? 0 points  Count back from 20 0 points  Months in reverse 0 points  Repeat phrase 0 points  Total Score 0    Screening Tests Health Maintenance  Topic Date Due  . Hepatitis C Screening  Dec 19, 1950  . FOOT EXAM  06/11/1962  . COLONOSCOPY  06/05/2020 (Originally 06/11/2002)  . INFLUENZA VACCINE  06/18/2019  . HEMOGLOBIN A1C  06/24/2019  . OPHTHALMOLOGY EXAM  07/02/2019  . TETANUS/TDAP  11/17/2021  . PNA vac Low Risk Adult  Completed      Plan:    End of life planning; Advance aging; Advanced directives discussed.  Copy of current HCPOA/Living Will requested.    I have personally reviewed and noted the following in the patient's chart:   . Medical and social history . Use of alcohol, tobacco or illicit drugs  . Current medications and supplements . Functional ability and status . Nutritional status . Physical activity . Advanced directives . List of other physicians . Hospitalizations, surgeries, and ER visits in previous 12 months . Vitals . Screenings to include cognitive, depression, and falls . Referrals and appointments  In addition, I have reviewed and discussed with patient certain preventive protocols, quality metrics, and best practice recommendations. A written personalized care plan  for preventive services as well as general preventive health recommendations were provided to patient.     Varney Biles, LPN   [id number]/[id number]    Reviewed above information.  Agree with assessment and plan.   Dr Nicki Reaper

## 2019-06-06 NOTE — Patient Instructions (Addendum)
  Mr. Batta , Thank you for taking time to come for your Medicare Wellness Visit. I appreciate your ongoing commitment to your health goals. Please review the following plan we discussed and let me know if I can assist you in the future.   These are the goals we discussed: Goals    . Follow up with Primary Care Provider     Keep all routine maintenance appointments.       This is a list of the screening recommended for you and due dates:  Health Maintenance  Topic Date Due  .  Hepatitis C: One time screening is recommended by Center for Disease Control  (CDC) for  adults born from [birth year] through 1964.   07-14-1951  . Complete foot exam   06/11/1962  . Colon Cancer Screening  06/05/2020*  . Flu Shot  06/18/2019  . Hemoglobin A1C  06/24/2019  . Eye exam for diabetics  07/02/2019  . Tetanus Vaccine  11/17/2021  . Pneumonia vaccines  Completed  *Topic was postponed. The date shown is not the original due date.

## 2019-07-26 ENCOUNTER — Emergency Department: Payer: Medicare Other

## 2019-07-26 ENCOUNTER — Ambulatory Visit: Payer: Self-pay | Admitting: *Deleted

## 2019-07-26 ENCOUNTER — Emergency Department
Admission: EM | Admit: 2019-07-26 | Discharge: 2019-07-26 | Disposition: A | Discharge status: Home / Self Care | Payer: Medicare Other | Attending: Emergency Medicine | Admitting: Emergency Medicine

## 2019-07-26 ENCOUNTER — Encounter: Payer: Self-pay | Admitting: Emergency Medicine

## 2019-07-26 ENCOUNTER — Other Ambulatory Visit: Payer: Self-pay

## 2019-07-26 DIAGNOSIS — R509 Fever, unspecified: Secondary | ICD-10-CM | POA: Diagnosis not present

## 2019-07-26 DIAGNOSIS — I10 Essential (primary) hypertension: Secondary | ICD-10-CM | POA: Insufficient documentation

## 2019-07-26 DIAGNOSIS — Z7982 Long term (current) use of aspirin: Secondary | ICD-10-CM | POA: Insufficient documentation

## 2019-07-26 DIAGNOSIS — R5383 Other fatigue: Secondary | ICD-10-CM | POA: Diagnosis not present

## 2019-07-26 DIAGNOSIS — U071 COVID-19: Secondary | ICD-10-CM | POA: Diagnosis not present

## 2019-07-26 DIAGNOSIS — Z79899 Other long term (current) drug therapy: Secondary | ICD-10-CM | POA: Insufficient documentation

## 2019-07-26 LAB — CBC
HCT: 50 % (ref 39.0–52.0)
Hemoglobin: 17.5 g/dL — ABNORMAL HIGH (ref 13.0–17.0)
MCH: 31.4 pg (ref 26.0–34.0)
MCHC: 35 g/dL (ref 30.0–36.0)
MCV: 89.8 fL (ref 80.0–100.0)
Platelets: 181 10*3/uL (ref 150–400)
RBC: 5.57 MIL/uL (ref 4.22–5.81)
RDW: 12.4 % (ref 11.5–15.5)
WBC: 7.3 10*3/uL (ref 4.0–10.5)
nRBC: 0 % (ref 0.0–0.2)

## 2019-07-26 LAB — BASIC METABOLIC PANEL
Anion gap: 15 (ref 5–15)
BUN: 31 mg/dL — ABNORMAL HIGH (ref 8–23)
CO2: 23 mmol/L (ref 22–32)
Calcium: 9.3 mg/dL (ref 8.9–10.3)
Chloride: 98 mmol/L (ref 98–111)
Creatinine, Ser: 1.45 mg/dL — ABNORMAL HIGH (ref 0.61–1.24)
GFR calc Af Amer: 57 mL/min — ABNORMAL LOW (ref >60)
GFR calc non Af Amer: 49 mL/min — ABNORMAL LOW (ref >60)
Glucose, Bld: 138 mg/dL — ABNORMAL HIGH (ref 70–99)
Potassium: 3.5 mmol/L (ref 3.5–5.1)
Sodium: 136 mmol/L (ref 135–145)

## 2019-07-26 LAB — GLUCOSE, CAPILLARY: Glucose-Capillary: 130 mg/dL — ABNORMAL HIGH (ref 70–99)

## 2019-07-26 MED ORDER — ONDANSETRON 4 MG PO TBDP
8.0000 mg | ORAL_TABLET | Freq: Once | ORAL | Status: AC
  Administered 2019-07-26: 8 mg via ORAL
  Filled 2019-07-26: qty 2

## 2019-07-26 MED ORDER — ONDANSETRON 4 MG PO TBDP: 4.0000 mg | ORAL_TABLET | Freq: Three times a day (TID) | ORAL | 0 refills | Status: DC | PRN

## 2019-07-26 MED ORDER — ACETAMINOPHEN 500 MG PO TABS
1000.0000 mg | ORAL_TABLET | Freq: Once | ORAL | Status: AC
  Administered 2019-07-26: 22:00:00 1000 mg via ORAL
  Filled 2019-07-26: qty 2

## 2019-07-26 NOTE — ED Notes (Signed)
Patient's wife expressed more concerns about patient's ongoing weakness and lethargy. Will relay to EDP

## 2019-07-26 NOTE — Telephone Encounter (Signed)
Agree with evaluation today.  

## 2019-07-26 NOTE — ED Notes (Signed)
Patient provided with water.

## 2019-07-26 NOTE — Telephone Encounter (Signed)
FYI for you, will follow up to be sure he was evaluated once they get home.

## 2019-07-26 NOTE — Telephone Encounter (Signed)
Sent to wrong office. Routing to [facility].

## 2019-07-26 NOTE — Discharge Instructions (Addendum)
Please quarantine at home until your symptoms have resolved and your COVID test comes back negative.

## 2019-07-26 NOTE — Telephone Encounter (Signed)
Patient reports he fainted around 1:00am this morning while outside with his dog. Unsure if he lost consciousness. Did not hit his head only scrapes on knee. Afterwards he experienced brief nausea and a hot flash these resolved quickly with rest. Denies any dizziness/CP/SOB/one-sided weakness/HA. No pain reported. Reports decreased appetite and energy for several days. Stated urine output normal. 2 small watery stools this morning. Has dry mouth.Sipping on gatorade now.Mild congestion. Fever last week for one day only. No B/P reading to report. Wife is driving him home from [place] at this time. Spoke with pcp and no availability late this afternoon or tomorrow. Advised ED or UC at this time.   Reason for Disposition . [1] Drinking very little AND [2] dehydration suspected (e.g., no urine > 12 hours, very dry mouth, very lightheaded)  Answer Assessment - Initial Assessment Questions 1. ONSET: "How long were you unconscious?" (minutes) "When did it happen?"    Fell. Unsure. Really can't say if he was unconscious at all.  2. CONTENT: "What happened during period of unconsciousness?" (e.g., seizure activity)      Went down to ground. 3. MENTAL STATUS: "Alert and oriented now?" (oriented x 3 = name, month, location)      yes 4. TRIGGER: "What do you think caused the fainting?" "What were you doing just before you fainted?"  (e.g., exercise, sudden standing up, prolonged standing)     unsure 5. RECURRENT SYMPTOM: "Have you ever passed out before?" If so, ask: "When was the last time?" and "What happened that time?"      no 6. INJURY: "Did you sustain any injury during the fall?"      Not really 7. CARDIAC SYMPTOMS: "Have you had any of the following symptoms: chest pain, difficulty breathing, palpitations?"     none 8. NEUROLOGIC SYMPTOMS: "Have you had any of the following symptoms: headache, numbness, vertigo, weakness?"     no 9. GI SYMPTOMS: "Have you had any of the following symptoms:  abdominal pain, vomiting, diarrhea, blood in stools?"     Drinking Gatorade now. Not eating hardly at all. 10. OTHER SYMPTOMS: "Do you have any other symptoms?"       no 11. PREGNANCY: "Is there any chance you are pregnant?" "When was your last menstrual period?"       na  Protocols used: [facility]

## 2019-07-26 NOTE — ED Provider Notes (Signed)
[facility] Emergency Department Provider Note  ____________________________________________  Time seen: Approximately 10:45 PM  I have reviewed the triage vital signs and the nursing notes.   HISTORY  Chief Complaint Fever and Loss of Consciousness    HPI Jamie Martin is a 68 y.o. male with a history of hypertension, diabetes, hyperlipidemia who comes the ED reporting malaise and fatigue over the past 8 or 9 days.  Recently took a trip to the beach.  Also has been around his grandchildren who have had a febrile illness recently.  Has not been tested for COVID.  Denies chest pain shortness of breath back pain abdominal pain.  He has decreased appetite and nausea.  He reports fatigue and chills.  No specific body aches.  No headache vision changes paresthesias or weakness.  Last night he got up at 1:30 in the morning to walk his dog.  He reports getting lightheaded and passing out while walking the dog after which she felt normal.  No prodromal symptoms.     Past Medical History:  Diagnosis Date  . Chicken pox   . History of hyperglycemia   . Hypertension   . Plantar fasciitis    History of     Patient Active Problem List   Diagnosis Date Noted  . Knee pain 06/27/2018  . Health care maintenance 12/31/2014  . Cerumen impaction 06/11/2014  . Erectile dysfunction 11/06/2013  . Hyperbilirubinemia 12/04/2012  . Hematuria 12/04/2012  . Abnormal liver function tests 12/04/2012  . Hypertension 12/03/2012  . Hypercholesterolemia 12/03/2012  . Diabetes mellitus ([place]) 12/03/2012     Past Surgical History:  Procedure Laterality Date  . arthroscopic knee     x2- Performed by Dr. by Leanor Kail  . FOOT SURGERY  2009  . KNEE ARTHROSCOPY  [id number]   right knee  . PLANTAR FASCIECTOMY  2012  . TONSILLECTOMY    . TONSILLECTOMY AND ADENOIDECTOMY       Prior to Admission medications   Medication Sig Start Date End Date Taking? Authorizing  Provider  Aspirin 81 MG EC tablet Take 81 mg by mouth daily.    [provider]  bisoprolol-hydrochlorothiazide (ZIAC) 10-6.25 MG tablet Take 1 tablet by mouth daily. 04/05/19   Einar Pheasant, MD  lisinopril-hydrochlorothiazide (ZESTORETIC) 10-12.5 MG tablet Take 1 tablet by mouth daily. 04/05/19   Einar Pheasant, MD  OMEGA 3 1000 MG CAPS Take by mouth 2 (two) times daily.    [provider]  tadalafil (CIALIS) 5 MG tablet Take 1 tablet (5 mg total) by mouth daily as needed for erectile dysfunction. 11/24/13   Einar Pheasant, MD     Allergies Patient has no known allergies.   Family History  Problem Relation Age of Onset  . Hypertension Mother   . Arthritis Mother   . Stroke Father   . Colon cancer Neg Hx   . Prostate cancer Neg Hx     Social History Social History   Tobacco Use  . Smoking status: Former Smoker    Quit date: 11/17/2005    Years since quitting: 13.6  . Smokeless tobacco: Never Used  Substance Use Topics  . Alcohol use: Yes    Alcohol/week: 0.0 standard drinks  . Drug use: No    Review of Systems  Constitutional: Positive fever and chills.  ENT:   No sore throat. No rhinorrhea. Cardiovascular:   No chest pain or syncope. Respiratory:   No dyspnea or cough. Gastrointestinal:   Negative for abdominal pain,  vomiting and diarrhea.  Musculoskeletal:   Negative for focal pain or swelling All other systems reviewed and are negative except as documented above in ROS and HPI.  ____________________________________________   PHYSICAL EXAM:  VITAL SIGNS: ED Triage Vitals  Enc Vitals Group     BP 07/26/19 1606 122/72     Pulse Rate 07/26/19 2032 90     Resp 07/26/19 1606 16     Temp 07/26/19 1606 (!) 101.8 F (38.8 C)     Temp Source 07/26/19 1606 Oral     SpO2 07/26/19 1606 95 %     Weight 07/26/19 1606 213 lb (96.6 kg)     Height 07/26/19 1606 5\' 7"  (1.702 m)     Head Circumference --      Peak Flow --      Pain Score 07/26/19 1606  0     Pain Loc --      Pain Edu? --      Excl. in [place]? --     Vital signs reviewed, nursing assessments reviewed.   Constitutional:   Alert and oriented. Non-toxic appearance. Eyes:   Conjunctivae are normal. EOMI. PERRL. ENT      Head:   Normocephalic and atraumatic.      Nose:   No congestion/rhinnorhea.       Mouth/Throat:   MMM, no pharyngeal erythema. No peritonsillar mass.       Neck:   No meningismus. Full ROM. Hematological/Lymphatic/Immunilogical:   No cervical lymphadenopathy. Cardiovascular:   RRR. Symmetric bilateral radial and DP pulses.  No murmurs. Cap refill less than 2 seconds. Respiratory:   Normal respiratory effort without tachypnea/retractions. Breath sounds are clear and equal bilaterally. No wheezes/rales/rhonchi. Gastrointestinal:   Soft and nontender. Non distended. There is no CVA tenderness.  No rebound, rigidity, or guarding.  Musculoskeletal:   Normal range of motion in all extremities. No joint effusions.  No lower extremity tenderness.  No edema. Neurologic:   Normal speech and language.  Motor grossly intact. No acute focal neurologic deficits are appreciated.  Skin:    Skin is warm, dry and intact. No rash noted.  No petechiae, purpura, or bullae.  ____________________________________________    LABS (pertinent positives/negatives) (all labs ordered are listed, but only abnormal results are displayed) Labs Reviewed  BASIC METABOLIC PANEL - Abnormal; Notable for the following components:      Result Value   Glucose, Bld 138 (*)    BUN 31 (*)    Creatinine, Ser 1.45 (*)    GFR calc non Af Amer 49 (*)    GFR calc Af Amer 57 (*)    All other components within normal limits  CBC - Abnormal; Notable for the following components:   Hemoglobin 17.5 (*)    All other components within normal limits  GLUCOSE, CAPILLARY - Abnormal; Notable for the following components:   Glucose-Capillary 130 (*)    All other components within normal limits  NOVEL  CORONAVIRUS, NAA (HOSP ORDER, SEND-OUT TO REF LAB; TAT 18-24 HRS)  URINALYSIS, COMPLETE (UACMP) WITH MICROSCOPIC  CBG MONITORING, ED  TROPONIN I (HIGH SENSITIVITY)   ____________________________________________   EKG  Interpreted by me  Date: 07/26/2019  Rate: 85  Rhythm: normal sinus rhythm  QRS Axis: normal  Intervals: normal  ST/T Wave abnormalities: normal  Conduction Disutrbances: none  Narrative Interpretation: unremarkable      ____________________________________________    RADIOLOGY  Dg Chest Portable 1 View  Result Date: 07/26/2019 CLINICAL DATA:  Fever, fatigue EXAM:  PORTABLE CHEST 1 VIEW COMPARISON:  None. FINDINGS: Heart and mediastinal contours are within normal limits. No focal opacities or effusions. No acute bony abnormality. IMPRESSION: No active disease. Electronically Signed   By: Rolm Baptise M.D.   On: 07/26/2019 21:12    ____________________________________________   PROCEDURES Procedures  ____________________________________________  DIFFERENTIAL DIAGNOSIS   Pneumonia, viral syndrome, COVID, dehydration  CLINICAL IMPRESSION / ASSESSMENT AND PLAN / ED COURSE  Medications ordered in the ED: Medications  ondansetron (ZOFRAN-ODT) disintegrating tablet 8 mg (8 mg Oral Given 07/26/19 2056)  acetaminophen (TYLENOL) tablet 1,000 mg (1,000 mg Oral Given 07/26/19 2206)    Pertinent labs & imaging results that were available during my care of the patient were reviewed by me and considered in my medical decision making (see chart for details).  Jamie Martin was evaluated in Emergency Department on 07/26/2019 for the symptoms described in the history of present illness. He was evaluated in the context of the global COVID-19 pandemic, which necessitated consideration that the patient might be at risk for infection with the SARS-CoV-2 virus that causes COVID-19. Institutional protocols and algorithms that pertain to the evaluation of patients at risk for  COVID-19 are in a state of rapid change based on information released by regulatory bodies including the CDC and federal and state organizations. These policies and algorithms were followed during the patient's care in the ED.   Patient presents with multiple constitutional symptoms, most likely a viral illness.  Suspicious for COVID especially with  recent beach trip.  Chest x-ray negative for signs of pneumonia, exam is reassuring he is nontoxic and well-appearing.  Labs are reassuring.  No evidence of electrolyte abnormality or acute endocrine crisis.  Labs are suggestive of a mild degree of dehydration.  Counseled on quarantining at home pending COVID result which I think most likely will be positive.      ____________________________________________   FINAL CLINICAL IMPRESSION(S) / ED DIAGNOSES    Final diagnoses:  Fever, unspecified fever cause  Fatigue, unspecified type     ED Discharge Orders    None      Portions of this note were generated with dragon dictation software. Dictation errors may occur despite best attempts at proofreading.   Carrie Mew, MD 07/26/19 2252

## 2019-07-26 NOTE — ED Triage Notes (Signed)
Pt in via POV, reports not feeling well, decreased appetite, generalized weakness over the last 8-9 days.  Pt also reports syncopal episode last night while walking the dog, denies hitting head.  Reports fever on Monday.  Does reports being around grandkids who have also had fever, denies anybody getting tested for COVID.  Pt febrile upon arrival, other vitals WDL.  NAD noted at this time.

## 2019-07-26 NOTE — Telephone Encounter (Signed)
Pt is at ED

## 2019-07-28 ENCOUNTER — Ambulatory Visit: Payer: Self-pay

## 2019-07-28 LAB — NOVEL CORONAVIRUS, NAA (HOSP ORDER, SEND-OUT TO REF LAB; TAT 18-24 HRS): SARS-CoV-2, NAA: DETECTED — AB

## 2019-07-28 NOTE — Telephone Encounter (Signed)
Wife reports she saw positive COVID results in pt.'s My Chart. Pt. Still having fevers, slight cough. Body aches are better. Taking Tylenol for fever, which helps. Encouraged to stay hydrated and rest. Will quarantine x 10 days from beginning of symptoms and fever free x 3 days without use of Tylenol. Requests Dr. Nicki Reaper be notified of positive COVID 19 results.

## 2019-07-28 NOTE — Telephone Encounter (Signed)
Patient doing okay. Spoke with his wife. He is eating and drinking, taking tylenol for fever, self isolating away from wife. Advised wife if his symptoms worsen he will need to go back to ED for evaluation. Also advised that patients wife will need to be tested as well. I have given her the info about where to be tested but I advised her to wait on a call back from me prior to going for testing. Do you want to do a virtual visit with them this PM to discuss quarantine, etc?

## 2019-07-28 NOTE — Telephone Encounter (Signed)
See note in wife's chart.

## 2019-07-28 NOTE — Telephone Encounter (Signed)
Ok to schedule doxy for him for f/u.    Agree with quarantine.

## 2019-07-28 NOTE — Telephone Encounter (Signed)
Please advise 

## 2019-08-12 ENCOUNTER — Other Ambulatory Visit: Payer: Medicare Other

## 2019-08-15 ENCOUNTER — Ambulatory Visit: Payer: Medicare Other | Admitting: Internal Medicine

## 2019-09-29 DIAGNOSIS — Z23 Encounter for immunization: Secondary | ICD-10-CM | POA: Diagnosis not present

## 2019-10-12 ENCOUNTER — Other Ambulatory Visit: Payer: Self-pay

## 2019-10-26 ENCOUNTER — Other Ambulatory Visit: Payer: Self-pay | Admitting: Internal Medicine

## 2019-11-24 DIAGNOSIS — H2513 Age-related nuclear cataract, bilateral: Secondary | ICD-10-CM | POA: Diagnosis not present

## 2020-02-14 DIAGNOSIS — M1711 Unilateral primary osteoarthritis, right knee: Secondary | ICD-10-CM | POA: Diagnosis not present

## 2020-03-13 ENCOUNTER — Encounter: Payer: Self-pay | Admitting: Internal Medicine

## 2020-03-22 ENCOUNTER — Other Ambulatory Visit: Payer: Self-pay

## 2020-03-22 ENCOUNTER — Encounter: Payer: Self-pay | Admitting: Internal Medicine

## 2020-03-22 MED ORDER — BISOPROLOL-HYDROCHLOROTHIAZIDE 10-6.25 MG PO TABS: 1.0000 | ORAL_TABLET | Freq: Every day | ORAL | 1 refills | Status: DC

## 2020-03-22 MED ORDER — LISINOPRIL-HYDROCHLOROTHIAZIDE 10-12.5 MG PO TABS: 1.0000 | ORAL_TABLET | Freq: Every day | ORAL | 1 refills | Status: DC

## 2020-05-11 ENCOUNTER — Ambulatory Visit: Payer: Medicare Other | Admitting: Internal Medicine

## 2020-06-06 ENCOUNTER — Ambulatory Visit (INDEPENDENT_AMBULATORY_CARE_PROVIDER_SITE_OTHER): Payer: Medicare Other

## 2020-06-06 VITALS — Ht 67.0 in | Wt 213.0 lb

## 2020-06-06 DIAGNOSIS — Z0000 Encounter for general adult medical examination without abnormal findings: Secondary | ICD-10-CM

## 2020-06-06 NOTE — Patient Instructions (Addendum)
Jamie Martin , Thank you for taking time to come for your Medicare Wellness Visit. I appreciate your ongoing commitment to your health goals. Please review the following plan we discussed and let me know if I can assist you in the future.   These are the goals we discussed: Goals    . Follow up with Primary Care Provider     Keep all routine maintenance appointments.       This is a list of the screening recommended for you and due dates:  Health Maintenance  Topic Date Due  . Complete foot exam   Never done  . Colon Cancer Screening  Never done  . Hemoglobin A1C  06/24/2019  .  Hepatitis C: One time screening is recommended by Center for Disease Control  (CDC) for  adults born from [birth year] through 1964.   06/13/2020*  . Flu Shot  06/17/2020  . Eye exam for diabetics  12/01/2020  . Tetanus Vaccine  11/17/2021  . COVID-19 Vaccine  Completed  . Pneumonia vaccines  Completed  *Topic was postponed. The date shown is not the original due date.    Immunizations Immunization History  Administered Date(s) Administered  . Influenza Split 07/05/2013, 08/03/2014  . Influenza, High Dose Seasonal PF 09/01/2018  . Influenza,inj,Quad PF,6+ Mos 07/24/2016  . Influenza-Unspecified 09/23/2011  . PFIZER SARS-COV-2 Vaccination 11/16/2019, 12/16/2019  . Pneumococcal Conjugate-13 09/24/2017  . Pneumococcal Polysaccharide-23 12/24/2018  . Td 01/29/2004   Keep all routine maintenance appointments.   Follow up 06/13/20 @ 9:30  Advanced directives: End of life planning; Advance aging; Advanced directives discussed.  Copy of current HCPOA/Living Will requested.    Conditions/risks identified: none new.   Follow up in one year for your annual wellness visit.   Preventive Care 60 Years and Older, Male Preventive care refers to lifestyle choices and visits with your health care provider that can promote health and wellness. What does preventive care include?  A yearly physical exam. This is also  called an annual well check.  Dental exams once or twice a year.  Routine eye exams. Ask your health care provider how often you should have your eyes checked.  Personal lifestyle choices, including:  Daily care of your teeth and gums.  Regular physical activity.  Eating a healthy diet.  Avoiding tobacco and drug use.  Limiting alcohol use.  Practicing safe sex.  Taking low doses of aspirin every day.  Taking vitamin and mineral supplements as recommended by your health care provider. What happens during an annual well check? The services and screenings done by your health care provider during your annual well check will depend on your age, overall health, lifestyle risk factors, and family history of disease. Counseling  Your health care provider may ask you questions about your:  Alcohol use.  Tobacco use.  Drug use.  Emotional well-being.  Home and relationship well-being.  Sexual activity.  Eating habits.  History of falls.  Memory and ability to understand (cognition).  Work and work Statistician. Screening  You may have the following tests or measurements:  Height, weight, and BMI.  Blood pressure.  Lipid and cholesterol levels. These may be checked every 5 years, or more frequently if you are over 52 years old.  Skin check.  Lung cancer screening. You may have this screening every year starting at age 27 if you have a 30-pack-year history of smoking and currently smoke or have quit within the past 15 years.  Fecal occult blood test (FOBT)  of the stool. You may have this test every year starting at age 1.  Flexible sigmoidoscopy or colonoscopy. You may have a sigmoidoscopy every 5 years or a colonoscopy every 10 years starting at age 65.  Prostate cancer screening. Recommendations will vary depending on your family history and other risks.  Hepatitis C blood test.  Hepatitis B blood test.  Sexually transmitted disease (STD)  testing.  Diabetes screening. This is done by checking your blood sugar (glucose) after you have not eaten for a while (fasting). You may have this done every 1-3 years.  Abdominal aortic aneurysm (AAA) screening. You may need this if you are a current or former smoker.  Osteoporosis. You may be screened starting at age 73 if you are at high risk. Talk with your health care provider about your test results, treatment options, and if necessary, the need for more tests. Vaccines  Your health care provider may recommend certain vaccines, such as:  Influenza vaccine. This is recommended every year.  Tetanus, diphtheria, and acellular pertussis (Tdap, Td) vaccine. You may need a Td booster every 10 years.  Zoster vaccine. You may need this after age 66.  Pneumococcal 13-valent conjugate (PCV13) vaccine. One dose is recommended after age 10.  Pneumococcal polysaccharide (PPSV23) vaccine. One dose is recommended after age 50. Talk to your health care provider about which screenings and vaccines you need and how often you need them. This information is not intended to replace advice given to you by your health care provider. Make sure you discuss any questions you have with your health care provider. Document Released: 11/30/2015 Document Revised: 07/23/2016 Document Reviewed: 09/04/2015 Elsevier Interactive Patient Education  2017 [employer] Prevention in the Home Falls can cause injuries. They can happen to people of all ages. There are many things you can do to make your home safe and to help prevent falls. What can I do on the outside of my home?  Regularly fix the edges of walkways and driveways and fix any cracks.  Remove anything that might make you trip as you walk through a door, such as a raised step or threshold.  Trim any bushes or trees on the path to your home.  Use bright outdoor lighting.  Clear any walking paths of anything that might make someone trip, such as  rocks or tools.  Regularly check to see if handrails are loose or broken. Make sure that both sides of any steps have handrails.  Any raised decks and porches should have guardrails on the edges.  Have any leaves, snow, or ice cleared regularly.  Use sand or salt on walking paths during winter.  Clean up any spills in your garage right away. This includes oil or grease spills. What can I do in the bathroom?  Use night lights.  Install grab bars by the toilet and in the tub and shower. Do not use towel bars as grab bars.  Use non-skid mats or decals in the tub or shower.  If you need to sit down in the shower, use a plastic, non-slip stool.  Keep the floor dry. Clean up any water that spills on the floor as soon as it happens.  Remove soap buildup in the tub or shower regularly.  Attach bath mats securely with double-sided non-slip rug tape.  Do not have throw rugs and other things on the floor that can make you trip. What can I do in the bedroom?  Use night lights.  Make sure  that you have a light by your bed that is easy to reach.  Do not use any sheets or blankets that are too big for your bed. They should not hang down onto the floor.  Have a firm chair that has side arms. You can use this for support while you get dressed.  Do not have throw rugs and other things on the floor that can make you trip. What can I do in the kitchen?  Clean up any spills right away.  Avoid walking on wet floors.  Keep items that you use a lot in easy-to-reach places.  If you need to reach something above you, use a strong step stool that has a grab bar.  Keep electrical cords out of the way.  Do not use floor polish or wax that makes floors slippery. If you must use wax, use non-skid floor wax.  Do not have throw rugs and other things on the floor that can make you trip. What can I do with my stairs?  Do not leave any items on the stairs.  Make sure that there are handrails on  both sides of the stairs and use them. Fix handrails that are broken or loose. Make sure that handrails are as long as the stairways.  Check any carpeting to make sure that it is firmly attached to the stairs. Fix any carpet that is loose or worn.  Avoid having throw rugs at the top or bottom of the stairs. If you do have throw rugs, attach them to the floor with carpet tape.  Make sure that you have a light switch at the top of the stairs and the bottom of the stairs. If you do not have them, ask someone to add them for you. What else can I do to help prevent falls?  Wear shoes that:  Do not have high heels.  Have rubber bottoms.  Are comfortable and fit you well.  Are closed at the toe. Do not wear sandals.  If you use a stepladder:  Make sure that it is fully opened. Do not climb a closed stepladder.  Make sure that both sides of the stepladder are locked into place.  Ask someone to hold it for you, if possible.  Clearly mark and make sure that you can see:  Any grab bars or handrails.  First and last steps.  Where the edge of each step is.  Use tools that help you move around (mobility aids) if they are needed. These include:  Canes.  Walkers.  Scooters.  Crutches.  Turn on the lights when you go into a dark area. Replace any light bulbs as soon as they burn out.  Set up your furniture so you have a clear path. Avoid moving your furniture around.  If any of your floors are uneven, fix them.  If there are any pets around you, be aware of where they are.  Review your medicines with your doctor. Some medicines can make you feel dizzy. This can increase your chance of falling. Ask your doctor what other things that you can do to help prevent falls. This information is not intended to replace advice given to you by your health care provider. Make sure you discuss any questions you have with your health care provider. Document Released: 08/30/2009 Document  Revised: 04/10/2016 Document Reviewed: 12/08/2014 Elsevier Interactive Patient Education  2017 [employer].

## 2020-06-06 NOTE — Progress Notes (Addendum)
Subjective:   Jamie Martin is a 69 y.o. male who presents for Medicare Annual/Subsequent preventive examination.  Review of Systems    No ROS.  Medicare Wellness Virtual Visit.   Cardiac Risk Factors include: advanced age (>28men, >39 women);diabetes mellitus;male gender;hypertension     Objective:    Today's Vitals   06/06/20 1432  Weight: 213 lb (96.6 kg)  Height: 5\' 7"  (1.702 m)   Body mass index is 33.36 kg/m.  Advanced Directives 06/06/2020 07/26/2019 06/06/2019  Does Patient Have a Medical Advance Directive? Yes Yes Yes  Type of Paramedic of [place];Living will [place];Living will [place];Living will  Does patient want to make changes to medical advance directive? No - Patient declined No - Patient declined No - Patient declined  Copy of [place] in Chart? No - copy requested No - copy requested No - copy requested    Current Medications (verified) Outpatient Encounter Medications as of 06/06/2020  Medication Sig  . Aspirin 81 MG EC tablet Take 81 mg by mouth daily.  . bisoprolol-hydrochlorothiazide (ZIAC) 10-6.25 MG tablet Take 1 tablet by mouth daily.  Marland Kitchen lisinopril-hydrochlorothiazide (ZESTORETIC) 10-12.5 MG tablet Take 1 tablet by mouth daily.  . OMEGA 3 1000 MG CAPS Take by mouth 2 (two) times daily.  . ondansetron (ZOFRAN ODT) 4 MG disintegrating tablet Take 1 tablet (4 mg total) by mouth every 8 (eight) hours as needed for nausea or vomiting.  . tadalafil (CIALIS) 5 MG tablet Take 1 tablet (5 mg total) by mouth daily as needed for erectile dysfunction.   No facility-administered encounter medications on file as of 06/06/2020.    Allergies (verified) Patient has no known allergies.   History: Past Medical History:  Diagnosis Date  . Chicken pox   . History of hyperglycemia   . Hypertension   . Plantar fasciitis    History of   Past Surgical History:  Procedure  Laterality Date  . arthroscopic knee     x2- Performed by Dr. by Leanor Kail  . FOOT SURGERY  2009  . KNEE ARTHROSCOPY  [id number]   right knee  . PLANTAR FASCIECTOMY  2012  . TONSILLECTOMY    . TONSILLECTOMY AND ADENOIDECTOMY     Family History  Problem Relation Age of Onset  . Hypertension Mother   . Arthritis Mother   . Stroke Father   . Colon cancer Neg Hx   . Prostate cancer Neg Hx    Social History   Socioeconomic History  . Marital status: Married    Spouse name: Not on file  . Number of children: Not on file  . Years of education: Not on file  . Highest education level: Not on file  Occupational History  . Not on file  Tobacco Use  . Smoking status: Former Smoker    Quit date: 11/17/2005    Years since quitting: 14.5  . Smokeless tobacco: Never Used  Vaping Use  . Vaping Use: Never used  Substance and Sexual Activity  . Alcohol use: Yes    Alcohol/week: 0.0 standard drinks  . Drug use: No  . Sexual activity: Not on file  Other Topics Concern  . Not on file  Social History Narrative   Married and has 2 children.   Social Determinants of Health   Financial Resource Strain:   . Difficulty of Paying Living Expenses:   Food Insecurity:   . Worried About Charity fundraiser  in the Last Year:   . [place] in the Last Year:   Transportation Needs:   . Film/video editor (Medical):   Marland Kitchen Lack of Transportation (Non-Medical):   Physical Activity:   . Days of Exercise per Week:   . Minutes of Exercise per Session:   Stress:   . Feeling of Stress :   Social Connections: Unknown  . Frequency of Communication with Friends and Family: Not on file  . Frequency of Social Gatherings with Friends and Family: More than three times a week  . Attends Religious Services: Not on file  . Active Member of Clubs or Organizations: Not on file  . Attends Archivist Meetings: Not on file  . Marital Status: Married    Tobacco Counseling Counseling  given: Not Answered   Clinical Intake:  Pre-visit preparation completed: Yes        Diabetes: Yes (Followed by pcp)  How often do you need to have someone help you when you read instructions, pamphlets, or other written materials from your doctor or pharmacy?: 1 - Never  Interpreter Needed?: No      Activities of Daily Living In your present state of health, do you have any difficulty performing the following activities: 06/06/2020  Hearing? N  Vision? N  Difficulty concentrating or making decisions? N  Walking or climbing stairs? N  Dressing or bathing? N  Doing errands, shopping? N  Preparing Food and eating ? N  Using the Toilet? N  In the past six months, have you accidently leaked urine? N  Do you have problems with loss of bowel control? N  Managing your Medications? N  Managing your Finances? N  Housekeeping or managing your Housekeeping? N  Some recent data might be hidden    Patient Care Team: Einar Pheasant, MD as PCP - General (Internal Medicine)  Indicate any recent Medical Services you may have received from other than [facility] providers in the past year (date may be approximate).     Assessment:   This is a routine wellness examination for [name].  I connected with Jamie Martin today by telephone and verified that I am speaking with the correct person using two identifiers. Location patient: home Location provider: work Persons participating in the virtual visit: patient, Marine scientist.    I discussed the limitations, risks, security and privacy concerns of performing an evaluation and management service by telephone and the availability of in person appointments. The patient expressed understanding and verbally consented to this telephonic visit.    Interactive audio and video telecommunications were attempted between this provider and patient, however failed, due to patient having technical difficulties OR patient did not have access to video capability.  We  continued and completed visit with audio only.  Some vital signs may be absent or patient reported.   Hearing/Vision screen  Hearing Screening   125Hz  250Hz  500Hz  1000Hz  2000Hz  3000Hz  4000Hz  6000Hz  8000Hz   Right ear:           Left ear:           Comments: Patient is able to hear conversational tones without difficulty.  No issues reported.  Vision Screening Comments: Followed by [facility] Cataract extraction, bilateral No retinopathy reported Visual acuity not assessed, virtual visit.  They have seen their ophthalmologist in the last 12 months.     Dietary issues and exercise activities discussed: Current Exercise Habits: Home exercise routine, Intensity: Mild  Healthy diet Good water intake  Goals    .  Follow up with Primary Care Provider     Keep all routine maintenance appointments.      Depression Screen PHQ 2/9 Scores 06/06/2020 06/06/2019 02/26/2018 03/26/2017 07/24/2016  PHQ - 2 Score 0 0 0 0 0  PHQ- 9 Score - - 0 0 -    Fall Risk Fall Risk  06/06/2020 10/12/2019 06/06/2019 03/26/2017 07/24/2016  Falls in the past year? 0 0 0 No No  Comment - Emmi Telephone Survey: data to providers prior to load - - -  Number falls in past yr: 0 - - - -  Follow up Falls evaluation completed - - - -   Handrails in use when climbing stairs? Yes  Home free of loose throw rugs in walkways, pet beds, electrical cords, etc? Yes  Adequate lighting in your home to reduce risk of falls? Yes   ASSISTIVE DEVICES UTILIZED TO PREVENT FALLS:  Life alert? No  Use of a cane, walker or w/c? No  Grab bars in the bathroom? No  Shower chair or bench in shower? No  Elevated toilet seat or a handicapped toilet? No   TIMED UP AND GO:  Was the test performed? No . Virtual visit.   Cognitive Function: MMSE - Mini Mental State Exam 06/06/2020  Not completed: Unable to complete  Patient is alert and oriented x3.   6CIT Screen 06/06/2019  What Year? 0 points  What month? 0 points  What  time? 0 points  Count back from 20 0 points  Months in reverse 0 points  Repeat phrase 0 points  Total Score 0   Immunizations Immunization History  Administered Date(s) Administered  . Influenza Split 07/05/2013, 08/03/2014  . Influenza, High Dose Seasonal PF 09/01/2018  . Influenza,inj,Quad PF,6+ Mos 07/24/2016  . Influenza-Unspecified 09/23/2011  . PFIZER SARS-COV-2 Vaccination 11/16/2019, 12/16/2019  . Pneumococcal Conjugate-13 09/24/2017  . Pneumococcal Polysaccharide-23 12/24/2018  . Td 01/29/2004   Health Maintenance Health Maintenance  Topic Date Due  . FOOT EXAM  Never done  . COLONOSCOPY  Never done  . HEMOGLOBIN A1C  06/24/2019  . Hepatitis C Screening  06/13/2020 (Originally [birth year])  . INFLUENZA VACCINE  06/17/2020  . OPHTHALMOLOGY EXAM  12/01/2020  . TETANUS/TDAP  11/17/2021  . COVID-19 Vaccine  Completed  . PNA vac Low Risk Adult  Completed   Hepaptitis C Screening- deferred per patient preference. Order expired; 06/06/19.   Foot exam- denies wounds, numbness, tingling and all other symptoms.   A1C- 12/24/18 (6.9)  Dental Screening: Recommended annual dental exams for proper oral hygiene. Visits every 6 months.  Community Resource Referral / Chronic Care Management: CRR required this visit?  No   CCM required this visit?  No      Plan:   Keep all routine maintenance appointments.   Follow up 06/13/20 @ 9:30  I have personally reviewed and noted the following in the patient's chart:   . Medical and social history . Use of alcohol, tobacco or illicit drugs  . Current medications and supplements . Functional ability and status . Nutritional status . Physical activity . Advanced directives . List of other physicians . Hospitalizations, surgeries, and ER visits in previous 12 months . Vitals . Screenings to include cognitive, depression, and falls . Referrals and appointments  In addition, I have reviewed and discussed with patient certain  preventive protocols, quality metrics, and best practice recommendations. A written personalized care plan for preventive services as well as general preventive health recommendations were provided to patient via mychart.  Varney Biles, LPN   1/72/0910   Reviewed above information.  Agree with assessment and plan.    Dr Nicki Reaper

## 2020-06-13 ENCOUNTER — Encounter: Payer: Self-pay | Admitting: Internal Medicine

## 2020-06-13 ENCOUNTER — Ambulatory Visit (INDEPENDENT_AMBULATORY_CARE_PROVIDER_SITE_OTHER): Payer: Medicare Other | Admitting: Internal Medicine

## 2020-06-13 ENCOUNTER — Other Ambulatory Visit: Payer: Self-pay

## 2020-06-13 VITALS — BP 138/84 | HR 77 | Temp 98.4°F | Resp 16 | Ht 67.0 in | Wt 224.6 lb

## 2020-06-13 DIAGNOSIS — E1165 Type 2 diabetes mellitus with hyperglycemia: Secondary | ICD-10-CM

## 2020-06-13 DIAGNOSIS — Z125 Encounter for screening for malignant neoplasm of prostate: Secondary | ICD-10-CM | POA: Diagnosis not present

## 2020-06-13 DIAGNOSIS — E7800 Pure hypercholesterolemia, unspecified: Secondary | ICD-10-CM

## 2020-06-13 DIAGNOSIS — I10 Essential (primary) hypertension: Secondary | ICD-10-CM | POA: Diagnosis not present

## 2020-06-13 DIAGNOSIS — M25561 Pain in right knee: Secondary | ICD-10-CM | POA: Diagnosis not present

## 2020-06-13 NOTE — Progress Notes (Signed)
Patient ID: Jamie Martin, male   DOB: 1951/08/02, 70 y.o.   MRN: [id number]   Subjective:    Patient ID: Jamie Martin, male    DOB: 09-Dec-1950, 69 y.o.   MRN: [id number]  HPI This visit occurred during the SARS-CoV-2 public health emergency.  Safety protocols were in place, including screening questions prior to the visit, additional usage of staff PPE, and extensive cleaning of exam room while observing appropriate contact time as indicated for disinfecting solutions.  Patient here for a scheduled follow up. He is doing well. Feels good.  Staying active.  Been at the beach a lot.  No chest pain or sob.  No acid reflux or abdominal pain reported.  Bowels moving.  Blood pressures ok on outside checks.  134/73 his last check.  113/74 at the dentist yesterday.  S/p right knee injection 01/2020.  Takes meloxicam prn.  Overall feels good.  Sees dermatology regularly.     Past Medical History:  Diagnosis Date  . Chicken pox   . History of hyperglycemia   . Hypertension   . Plantar fasciitis    History of   Past Surgical History:  Procedure Laterality Date  . arthroscopic knee     x2- Performed by Dr. by Leanor Kail  . FOOT SURGERY  2009  . KNEE ARTHROSCOPY  [id number]   right knee  . PLANTAR FASCIECTOMY  2012  . TONSILLECTOMY    . TONSILLECTOMY AND ADENOIDECTOMY     Family History  Problem Relation Age of Onset  . Hypertension Mother   . Arthritis Mother   . Stroke Father   . Colon cancer Neg Hx   . Prostate cancer Neg Hx    Social History   Socioeconomic History  . Marital status: Married    Spouse name: Not on file  . Number of children: Not on file  . Years of education: Not on file  . Highest education level: Not on file  Occupational History  . Not on file  Tobacco Use  . Smoking status: Former Smoker    Quit date: 11/17/2005    Years since quitting: 14.5  . Smokeless tobacco: Never Used  Vaping Use  . Vaping Use: Never used  Substance and Sexual Activity  .  Alcohol use: Yes    Alcohol/week: 0.0 standard drinks  . Drug use: No  . Sexual activity: Not on file  Other Topics Concern  . Not on file  Social History Narrative   Married and has 2 children.   Social Determinants of Health   Financial Resource Strain:   . Difficulty of Paying Living Expenses:   Food Insecurity:   . Worried About Charity fundraiser in the Last Year:   . Arboriculturist in the Last Year:   Transportation Needs:   . Film/video editor (Medical):   Marland Kitchen Lack of Transportation (Non-Medical):   Physical Activity:   . Days of Exercise per Week:   . Minutes of Exercise per Session:   Stress:   . Feeling of Stress :   Social Connections: Unknown  . Frequency of Communication with Friends and Family: Not on file  . Frequency of Social Gatherings with Friends and Family: More than three times a week  . Attends Religious Services: Not on file  . Active Member of Clubs or Organizations: Not on file  . Attends Archivist Meetings: Not on file  . Marital Status: Married    Outpatient Encounter Medications  as of 06/13/2020  Medication Sig  . Aspirin 81 MG EC tablet Take 81 mg by mouth daily.  . bisoprolol-hydrochlorothiazide (ZIAC) 10-6.25 MG tablet Take 1 tablet by mouth daily.  Marland Kitchen lisinopril-hydrochlorothiazide (ZESTORETIC) 10-12.5 MG tablet Take 1 tablet by mouth daily.  . meloxicam (MOBIC) 15 MG tablet Take 15 mg by mouth daily.  . OMEGA 3 1000 MG CAPS Take by mouth 2 (two) times daily.  . ondansetron (ZOFRAN ODT) 4 MG disintegrating tablet Take 1 tablet (4 mg total) by mouth every 8 (eight) hours as needed for nausea or vomiting.  . tadalafil (CIALIS) 5 MG tablet Take 1 tablet (5 mg total) by mouth daily as needed for erectile dysfunction.   No facility-administered encounter medications on file as of 06/13/2020.    Review of Systems  Constitutional: Negative for appetite change and unexpected weight change.  HENT: Negative for congestion and sinus  pressure.   Respiratory: Negative for cough, chest tightness and shortness of breath.   Cardiovascular: Negative for chest pain, palpitations and leg swelling.  Gastrointestinal: Negative for abdominal pain, diarrhea, nausea and vomiting.  Genitourinary: Negative for difficulty urinating and dysuria.  Musculoskeletal: Negative for joint swelling and myalgias.  Skin: Negative for color change and rash.  Neurological: Negative for dizziness, light-headedness and headaches.  Psychiatric/Behavioral: Negative for agitation and dysphoric mood.       Objective:    Physical Exam Constitutional:      General: He is not in acute distress.    Appearance: Normal appearance. He is well-developed.  HENT:     Head: Normocephalic and atraumatic.     Right Ear: External ear normal.     Left Ear: External ear normal.  Eyes:     General: No scleral icterus.       Right eye: No discharge.        Left eye: No discharge.     Conjunctiva/sclera: Conjunctivae normal.  Cardiovascular:     Rate and Rhythm: Normal rate and regular rhythm.  Pulmonary:     Effort: Pulmonary effort is normal. No respiratory distress.     Breath sounds: Normal breath sounds.  Abdominal:     General: Bowel sounds are normal.     Palpations: Abdomen is soft.     Tenderness: There is no abdominal tenderness.  Musculoskeletal:        General: No swelling or tenderness.     Cervical back: Neck supple. No tenderness.  Lymphadenopathy:     Cervical: No cervical adenopathy.  Skin:    Findings: No erythema or rash.  Neurological:     Mental Status: He is alert.  Psychiatric:        Mood and Affect: Mood normal.        Behavior: Behavior normal.     BP (!) 138/84   Pulse 77   Temp 98.4 F (36.9 C)   Resp 16   Ht '5\' 7"'  (1.702 m)   Wt (!) 224 lb 9.6 oz (101.9 kg)   SpO2 99%   BMI 35.18 kg/m  Wt Readings from Last 3 Encounters:  06/13/20 (!) 224 lb 9.6 oz (101.9 kg)  06/06/20 213 lb (96.6 kg)  07/26/19 213 lb  (96.6 kg)     Lab Results  Component Value Date   WBC 7.3 07/26/2019   HGB 17.5 (H) 07/26/2019   HCT 50.0 07/26/2019   PLT 181 07/26/2019   GLUCOSE 138 (H) 07/26/2019   CHOL 172 12/24/2018   TRIG 189.0 (H) 12/24/2018  HDL 35.10 (L) 12/24/2018   LDLDIRECT 103.9 05/03/2013   LDLCALC 99 12/24/2018   ALT 39 12/24/2018   AST 31 12/24/2018   NA 136 07/26/2019   K 3.5 07/26/2019   CL 98 07/26/2019   CREATININE 1.45 (H) 07/26/2019   BUN 31 (H) 07/26/2019   CO2 23 07/26/2019   TSH 1.32 12/24/2018   PSA 2.16 12/24/2018   INR 1.0 04/25/2011   HGBA1C 6.9 (H) 12/24/2018   MICROALBUR <0.7 07/07/2018    DG Chest Portable 1 View  Result Date: 07/26/2019 CLINICAL DATA:  Fever, fatigue EXAM: PORTABLE CHEST 1 VIEW COMPARISON:  None. FINDINGS: Heart and mediastinal contours are within normal limits. No focal opacities or effusions. No acute bony abnormality. IMPRESSION: No active disease. Electronically Signed   By: Rolm Baptise M.D.   On: 07/26/2019 21:12       Assessment & Plan:   Problem List Items Addressed This Visit    Hyperbilirubinemia    Follow liver panel.        Relevant Orders   Hepatic function panel   Hypercholesterolemia    Low cholesterol diet and exercise.  Follow lipid panel.  Obtain labs and discuss starting cholesterol medication.         Relevant Orders   Lipid panel   Hypertension    Blood pressure on recheck as outlined.  Discussed outside readings.  On lisinopril/hctz 10/12.5 and ziac 10/6.25.  Have him spot check his pressure.  Send in readings.  Follow pressures.  Follow metabolic panel.       Relevant Orders   CBC with Differential/Platelet   TSH   Knee pain    Has seen ortho (Dr Harlow Mares).  S/p injection.  Takes meloxicam prn.  Does not take regularly.  Continue f/u with ortho.        Type 2 diabetes mellitus with hyperglycemia (HCC)    Low carb diet and exercise.  Follow met b and a1c.        Relevant Orders   Hemoglobin G4P   Basic  metabolic panel   Microalbumin / creatinine urine ratio    Other Visit Diagnoses    Prostate cancer screening    -  Primary   Relevant Orders   PSA, Medicare       Einar Pheasant, MD

## 2020-06-15 ENCOUNTER — Telehealth: Payer: Self-pay | Admitting: Internal Medicine

## 2020-06-15 NOTE — Telephone Encounter (Signed)
Patient has fasting Lab appointment 06/27/20 okay to get A1c patient is pastdue.and 3 other care gaps.

## 2020-06-16 ENCOUNTER — Encounter: Payer: Self-pay | Admitting: Internal Medicine

## 2020-06-16 NOTE — Assessment & Plan Note (Signed)
Follow liver panel.  

## 2020-06-16 NOTE — Telephone Encounter (Signed)
Labs ordered. Thank you

## 2020-06-16 NOTE — Assessment & Plan Note (Addendum)
Low cholesterol diet and exercise.  Follow lipid panel.  Obtain labs and discuss starting cholesterol medication.

## 2020-06-16 NOTE — Assessment & Plan Note (Signed)
Has seen ortho (Dr Harlow Mares).  S/p injection.  Takes meloxicam prn.  Does not take regularly.  Continue f/u with ortho.

## 2020-06-16 NOTE — Assessment & Plan Note (Signed)
Blood pressure on recheck as outlined.  Discussed outside readings.  On lisinopril/hctz 10/12.5 and ziac 10/6.25.  Have him spot check his pressure.  Send in readings.  Follow pressures.  Follow metabolic panel.

## 2020-06-16 NOTE — Addendum Note (Signed)
Addended by: Alisa Graff on: 06/16/2020 02:40 PM   Modules accepted: Level of Service

## 2020-06-16 NOTE — Assessment & Plan Note (Signed)
Low carb diet and exercise.  Follow met b and a1c.   

## 2020-06-27 ENCOUNTER — Other Ambulatory Visit: Payer: Self-pay

## 2020-06-27 ENCOUNTER — Other Ambulatory Visit (INDEPENDENT_AMBULATORY_CARE_PROVIDER_SITE_OTHER): Payer: Medicare Other

## 2020-06-27 DIAGNOSIS — E7800 Pure hypercholesterolemia, unspecified: Secondary | ICD-10-CM | POA: Diagnosis not present

## 2020-06-27 DIAGNOSIS — E1165 Type 2 diabetes mellitus with hyperglycemia: Secondary | ICD-10-CM

## 2020-06-27 DIAGNOSIS — Z125 Encounter for screening for malignant neoplasm of prostate: Secondary | ICD-10-CM | POA: Diagnosis not present

## 2020-06-27 DIAGNOSIS — I10 Essential (primary) hypertension: Secondary | ICD-10-CM

## 2020-06-27 LAB — CBC WITH DIFFERENTIAL/PLATELET
Basophils Absolute: 0 10*3/uL (ref 0.0–0.1)
Basophils Relative: 0.5 % (ref 0.0–3.0)
Eosinophils Absolute: 0.3 10*3/uL (ref 0.0–0.7)
Eosinophils Relative: 3.8 % (ref 0.0–5.0)
HCT: 47.7 % (ref 39.0–52.0)
Hemoglobin: 16.5 g/dL (ref 13.0–17.0)
Lymphocytes Relative: 38.8 % (ref 12.0–46.0)
Lymphs Abs: 3.3 10*3/uL (ref 0.7–4.0)
MCHC: 34.5 g/dL (ref 30.0–36.0)
MCV: 95 fl (ref 78.0–100.0)
Monocytes Absolute: 0.8 10*3/uL (ref 0.1–1.0)
Monocytes Relative: 9.3 % (ref 3.0–12.0)
Neutro Abs: 4 10*3/uL (ref 1.4–7.7)
Neutrophils Relative %: 47.6 % (ref 43.0–77.0)
Platelets: 245 10*3/uL (ref 150.0–400.0)
RBC: 5.02 Mil/uL (ref 4.22–5.81)
RDW: 13.3 % (ref 11.5–15.5)
WBC: 8.5 10*3/uL (ref 4.0–10.5)

## 2020-06-27 LAB — HEPATIC FUNCTION PANEL
ALT: 18 U/L (ref 0–53)
AST: 17 U/L (ref 0–37)
Albumin: 4.5 g/dL (ref 3.5–5.2)
Alkaline Phosphatase: 72 U/L (ref 39–117)
Bilirubin, Direct: 0.2 mg/dL (ref 0.0–0.3)
Total Bilirubin: 1.3 mg/dL — ABNORMAL HIGH (ref 0.2–1.2)
Total Protein: 7.1 g/dL (ref 6.0–8.3)

## 2020-06-27 LAB — BASIC METABOLIC PANEL
BUN: 20 mg/dL (ref 6–23)
CO2: 25 mEq/L (ref 19–32)
Calcium: 9.9 mg/dL (ref 8.4–10.5)
Chloride: 101 mEq/L (ref 96–112)
Creatinine, Ser: 1.2 mg/dL (ref 0.40–1.50)
GFR: 60.2 mL/min (ref >60.00)
Glucose, Bld: 133 mg/dL — ABNORMAL HIGH (ref 70–99)
Potassium: 4.5 mEq/L (ref 3.5–5.1)
Sodium: 138 mEq/L (ref 135–145)

## 2020-06-27 LAB — MICROALBUMIN / CREATININE URINE RATIO
Creatinine,U: 42.2 mg/dL
Microalb Creat Ratio: 1.7 mg/g (ref 0.0–30.0)
Microalb, Ur: <0.7 mg/dL (ref 0.0–1.9)

## 2020-06-27 LAB — LIPID PANEL
Cholesterol: 167 mg/dL (ref 0–200)
HDL: 36.8 mg/dL — ABNORMAL LOW (ref >39.00)
LDL Cholesterol: 101 mg/dL — ABNORMAL HIGH (ref 0–99)
NonHDL: 129.7
Total CHOL/HDL Ratio: 5
Triglycerides: 143 mg/dL (ref 0.0–149.0)
VLDL: 28.6 mg/dL (ref 0.0–40.0)

## 2020-06-27 LAB — PSA, MEDICARE: PSA: 2.9 ng/ml (ref 0.10–4.00)

## 2020-06-27 LAB — TSH: TSH: 1.48 u[IU]/mL (ref 0.35–4.50)

## 2020-06-27 LAB — HEMOGLOBIN A1C: Hgb A1c MFr Bld: 5.9 % (ref 4.6–6.5)

## 2020-08-12 DIAGNOSIS — Z23 Encounter for immunization: Secondary | ICD-10-CM | POA: Diagnosis not present

## 2020-09-04 ENCOUNTER — Other Ambulatory Visit: Payer: Self-pay | Admitting: Internal Medicine

## 2020-10-18 ENCOUNTER — Encounter: Payer: Self-pay | Admitting: Internal Medicine

## 2020-10-18 ENCOUNTER — Ambulatory Visit (INDEPENDENT_AMBULATORY_CARE_PROVIDER_SITE_OTHER): Payer: Medicare Other | Admitting: Internal Medicine

## 2020-10-18 ENCOUNTER — Other Ambulatory Visit: Payer: Self-pay

## 2020-10-18 VITALS — BP 122/60 | HR 65 | Temp 98.4°F | Resp 16 | Ht 67.0 in | Wt 227.0 lb

## 2020-10-18 DIAGNOSIS — I10 Essential (primary) hypertension: Secondary | ICD-10-CM

## 2020-10-18 DIAGNOSIS — E1165 Type 2 diabetes mellitus with hyperglycemia: Secondary | ICD-10-CM | POA: Diagnosis not present

## 2020-10-18 DIAGNOSIS — Z0000 Encounter for general adult medical examination without abnormal findings: Secondary | ICD-10-CM | POA: Diagnosis not present

## 2020-10-18 DIAGNOSIS — E7800 Pure hypercholesterolemia, unspecified: Secondary | ICD-10-CM

## 2020-10-18 DIAGNOSIS — M25561 Pain in right knee: Secondary | ICD-10-CM

## 2020-10-18 DIAGNOSIS — R945 Abnormal results of liver function studies: Secondary | ICD-10-CM

## 2020-10-18 DIAGNOSIS — R7989 Other specified abnormal findings of blood chemistry: Secondary | ICD-10-CM

## 2020-10-18 DIAGNOSIS — Z1159 Encounter for screening for other viral diseases: Secondary | ICD-10-CM

## 2020-10-18 LAB — LIPID PANEL
Cholesterol: 160 mg/dL (ref 0–200)
HDL: 38.1 mg/dL — ABNORMAL LOW (ref >39.00)
LDL Cholesterol: 99 mg/dL (ref 0–99)
NonHDL: 121.55
Total CHOL/HDL Ratio: 4
Triglycerides: 113 mg/dL (ref 0.0–149.0)
VLDL: 22.6 mg/dL (ref 0.0–40.0)

## 2020-10-18 LAB — HEPATIC FUNCTION PANEL
ALT: 23 U/L (ref 0–53)
AST: 20 U/L (ref 0–37)
Albumin: 4.5 g/dL (ref 3.5–5.2)
Alkaline Phosphatase: 71 U/L (ref 39–117)
Bilirubin, Direct: 0.2 mg/dL (ref 0.0–0.3)
Total Bilirubin: 1.6 mg/dL — ABNORMAL HIGH (ref 0.2–1.2)
Total Protein: 6.9 g/dL (ref 6.0–8.3)

## 2020-10-18 LAB — BASIC METABOLIC PANEL
BUN: 22 mg/dL (ref 6–23)
CO2: 26 mEq/L (ref 19–32)
Calcium: 9.3 mg/dL (ref 8.4–10.5)
Chloride: 101 mEq/L (ref 96–112)
Creatinine, Ser: 1.13 mg/dL (ref 0.40–1.50)
GFR: 66.86 mL/min (ref >60.00)
Glucose, Bld: 126 mg/dL — ABNORMAL HIGH (ref 70–99)
Potassium: 3.9 mEq/L (ref 3.5–5.1)
Sodium: 136 mEq/L (ref 135–145)

## 2020-10-18 LAB — HEMOGLOBIN A1C: Hgb A1c MFr Bld: 6.1 % (ref 4.6–6.5)

## 2020-10-18 NOTE — Progress Notes (Signed)
Patient ID: Jamie Martin, male   DOB: 29-May-1951, 69 y.o.   MRN: [id number]   Subjective:    Patient ID: Jamie Martin, male    DOB: Dec 23, 1950, 69 y.o.   MRN: [id number]  HPI This visit occurred during the SARS-CoV-2 public health emergency.  Safety protocols were in place, including screening questions prior to the visit, additional usage of staff PPE, and extensive cleaning of exam room while observing appropriate contact time as indicated for disinfecting solutions.  Patient here for a physical exam.  He is doing well.  Feels good.  Tries to stay active.  No chest pain or sob reported.  No abdominal pain or bowel change reported.  Problems with right knee.  Pain - more with going down steps and down hills.  Blood pressure doing well.    Past Medical History:  Diagnosis Date  . Chicken pox   . History of hyperglycemia   . Hypertension   . Plantar fasciitis    History of   Past Surgical History:  Procedure Laterality Date  . arthroscopic knee     x2- Performed by Dr. by Leanor Kail  . FOOT SURGERY  2009  . KNEE ARTHROSCOPY  9093,1121   right knee  . PLANTAR FASCIECTOMY  2012  . TONSILLECTOMY    . TONSILLECTOMY AND ADENOIDECTOMY     Family History  Problem Relation Age of Onset  . Hypertension Mother   . Arthritis Mother   . Stroke Father   . Colon cancer Neg Hx   . Prostate cancer Neg Hx    Social History   Socioeconomic History  . Marital status: Married    Spouse name: Not on file  . Number of children: Not on file  . Years of education: Not on file  . Highest education level: Not on file  Occupational History  . Not on file  Tobacco Use  . Smoking status: Former Smoker    Quit date: 11/17/2005    Years since quitting: 14.9  . Smokeless tobacco: Never Used  Vaping Use  . Vaping Use: Never used  Substance and Sexual Activity  . Alcohol use: Yes    Alcohol/week: 0.0 standard drinks  . Drug use: No  . Sexual activity: Not on file  Other Topics Concern   . Not on file  Social History Narrative   Married and has 2 children.   Social Determinants of Health   Financial Resource Strain:   . Difficulty of Paying Living Expenses: Not on file  Food Insecurity:   . Worried About Charity fundraiser in the Last Year: Not on file  . Ran Out of Food in the Last Year: Not on file  Transportation Needs:   . Lack of Transportation (Medical): Not on file  . Lack of Transportation (Non-Medical): Not on file  Physical Activity:   . Days of Exercise per Week: Not on file  . Minutes of Exercise per Session: Not on file  Stress:   . Feeling of Stress : Not on file  Social Connections: Unknown  . Frequency of Communication with Friends and Family: Not on file  . Frequency of Social Gatherings with Friends and Family: More than three times a week  . Attends Religious Services: Not on file  . Active Member of Clubs or Organizations: Not on file  . Attends Archivist Meetings: Not on file  . Marital Status: Married    Outpatient Encounter Medications as of 10/18/2020  Medication Sig  .  Aspirin 81 MG EC tablet Take 81 mg by mouth daily.  . bisoprolol-hydrochlorothiazide (ZIAC) 10-6.25 MG tablet TAKE 1 TABLET DAILY  . lisinopril-hydrochlorothiazide (ZESTORETIC) 10-12.5 MG tablet TAKE 1 TABLET DAILY  . meloxicam (MOBIC) 15 MG tablet Take 15 mg by mouth daily.  . OMEGA 3 1000 MG CAPS Take by mouth 2 (two) times daily.  . ondansetron (ZOFRAN ODT) 4 MG disintegrating tablet Take 1 tablet (4 mg total) by mouth every 8 (eight) hours as needed for nausea or vomiting.  . tadalafil (CIALIS) 5 MG tablet Take 1 tablet (5 mg total) by mouth daily as needed for erectile dysfunction.   No facility-administered encounter medications on file as of 10/18/2020.    Review of Systems  Constitutional: Negative for appetite change and unexpected weight change.  HENT: Negative for congestion, sinus pressure and sore throat.   Eyes: Negative for pain and visual  disturbance.  Respiratory: Negative for cough, chest tightness and shortness of breath.   Cardiovascular: Negative for chest pain, palpitations and leg swelling.  Gastrointestinal: Negative for abdominal pain, diarrhea, nausea and vomiting.  Genitourinary: Negative for difficulty urinating and dysuria.  Musculoskeletal: Negative for myalgias.       Right knee pain as outlined.   Skin: Negative for color change and rash.  Neurological: Negative for dizziness, light-headedness and headaches.  Hematological: Negative for adenopathy. Does not bruise/bleed easily.  Psychiatric/Behavioral: Negative for agitation and dysphoric mood.       Objective:    Physical Exam Vitals reviewed.  Constitutional:      General: He is not in acute distress.    Appearance: Normal appearance. He is well-developed.  HENT:     Head: Normocephalic and atraumatic.     Right Ear: External ear normal.     Left Ear: External ear normal.  Eyes:     General: No scleral icterus.       Right eye: No discharge.        Left eye: No discharge.     Conjunctiva/sclera: Conjunctivae normal.  Neck:     Thyroid: No thyromegaly.  Cardiovascular:     Rate and Rhythm: Normal rate and regular rhythm.  Pulmonary:     Effort: No respiratory distress.     Breath sounds: Normal breath sounds. No wheezing.  Abdominal:     General: Bowel sounds are normal.     Palpations: Abdomen is soft.     Tenderness: There is no abdominal tenderness.  Musculoskeletal:        General: No swelling or tenderness.     Cervical back: Neck supple. No tenderness.  Lymphadenopathy:     Cervical: No cervical adenopathy.  Skin:    General: Skin is dry.     Findings: No erythema or rash.  Neurological:     Mental Status: He is alert and oriented to person, place, and time.  Psychiatric:        Mood and Affect: Mood normal.        Behavior: Behavior normal.     BP 122/60   Pulse 65   Temp 98.4 F (36.9 C) (Oral)   Resp 16   Ht '5\' 7"'   (1.702 m)   Wt 227 lb (103 kg)   SpO2 99%   BMI 35.55 kg/m  Wt Readings from Last 3 Encounters:  10/18/20 227 lb (103 kg)  06/13/20 (!) 224 lb 9.6 oz (101.9 kg)  06/06/20 213 lb (96.6 kg)     Lab Results  Component Value Date  WBC 8.5 06/27/2020   HGB 16.5 06/27/2020   HCT 47.7 06/27/2020   PLT 245.0 06/27/2020   GLUCOSE 126 (H) 10/18/2020   CHOL 160 10/18/2020   TRIG 113.0 10/18/2020   HDL 38.10 (L) 10/18/2020   LDLDIRECT 103.9 05/03/2013   LDLCALC 99 10/18/2020   ALT 23 10/18/2020   AST 20 10/18/2020   NA 136 10/18/2020   K 3.9 10/18/2020   CL 101 10/18/2020   CREATININE 1.13 10/18/2020   BUN 22 10/18/2020   CO2 26 10/18/2020   TSH 1.48 06/27/2020   PSA 2.90 06/27/2020   INR 1.0 04/25/2011   HGBA1C 6.1 10/18/2020   MICROALBUR <0.7 06/27/2020    DG Chest Portable 1 View  Result Date: 07/26/2019 CLINICAL DATA:  Fever, fatigue EXAM: PORTABLE CHEST 1 VIEW COMPARISON:  None. FINDINGS: Heart and mediastinal contours are within normal limits. No focal opacities or effusions. No acute bony abnormality. IMPRESSION: No active disease. Electronically Signed   By: Rolm Baptise M.D.   On: 07/26/2019 21:12       Assessment & Plan:   Problem List Items Addressed This Visit    Type 2 diabetes mellitus with hyperglycemia ([place])    Low carb diet and exercise.  Follow met b and a1c.       Relevant Orders   Hemoglobin A1c (Completed)   Basic metabolic panel (Completed)   Hemoglobin N6F   Basic metabolic panel   Hemoglobin R4V   Basic metabolic panel   Need for hepatitis C screening test    Check hepatitis C antibody with next labs.       Relevant Orders   Hepatitis C antibody   Knee pain    Has seen Dr Harlow Mares previously.  S/p injection.  Continue f/u with ortho.       Hypertension    Blood pressure on my check:  12878.  Continue lisinopril/hctz and ziac.  Follow pressures.  Follow metabolic panel.       Hypercholesterolemia    Low cholesterol diet and exercise.   Discuss statin medication.  Follow lipid panel and liver function tests.        Relevant Orders   Lipid panel (Completed)   Hepatic function panel   Lipid panel   Lipid panel   Hepatic function panel   Health care maintenance    Physical today 10/18/20.  Declines colonoscopy and cologuard.  Have discussed.  Follow psa.       Abnormal liver function tests    Follow liver panel.       Relevant Orders   Hepatic function panel (Completed)    Other Visit Diagnoses    Routine general medical examination at a health care facility    -  Primary       Einar Pheasant, MD

## 2020-10-21 ENCOUNTER — Encounter: Payer: Self-pay | Admitting: Internal Medicine

## 2020-10-21 DIAGNOSIS — Z1159 Encounter for screening for other viral diseases: Secondary | ICD-10-CM | POA: Insufficient documentation

## 2020-10-21 NOTE — Assessment & Plan Note (Signed)
Low carb diet and exercise.  Follow met b and a1c.  

## 2020-10-21 NOTE — Assessment & Plan Note (Signed)
Follow liver panel.  

## 2020-10-21 NOTE — Assessment & Plan Note (Signed)
Low cholesterol diet and exercise.  Discuss statin medication.  Follow lipid panel and liver function tests.

## 2020-10-21 NOTE — Assessment & Plan Note (Signed)
Has seen Dr Harlow Mares previously.  S/p injection.  Continue f/u with ortho.

## 2020-10-21 NOTE — Assessment & Plan Note (Signed)
Physical today 10/18/20.  Declines colonoscopy and cologuard.  Have discussed.  Follow psa.

## 2020-10-21 NOTE — Assessment & Plan Note (Signed)
Blood pressure on my check:  12878.  Continue lisinopril/hctz and ziac.  Follow pressures.  Follow metabolic panel.

## 2020-10-21 NOTE — Assessment & Plan Note (Signed)
Check hepatitis C antibody with next labs.

## 2020-10-22 DIAGNOSIS — M1711 Unilateral primary osteoarthritis, right knee: Secondary | ICD-10-CM | POA: Diagnosis not present

## 2020-10-22 MED ORDER — ROSUVASTATIN CALCIUM 5 MG PO TABS: 5.0000 mg | ORAL_TABLET | ORAL | 1 refills | Status: DC

## 2020-10-22 NOTE — Addendum Note (Signed)
Addended by: Sherrilee Gilles B on: 10/22/2020 03:21 PM   Modules accepted: Orders

## 2020-11-26 DIAGNOSIS — H2513 Age-related nuclear cataract, bilateral: Secondary | ICD-10-CM | POA: Diagnosis not present

## 2020-11-28 ENCOUNTER — Encounter: Payer: Self-pay | Admitting: Internal Medicine

## 2020-11-28 NOTE — Telephone Encounter (Signed)
Please call and notify pt that his form states "the following exams and testing must be completed".  There are multiple tests mentioned, which include pulmonary function tests and cancer screening.  He has not had these tests and assume that he cannot be cleared until these tests are complete.  Does he want to be scheduled for PFTs, etc?

## 2020-12-28 NOTE — Telephone Encounter (Signed)
Please make a copy of the cxr, labs, etc - listed on the form.  Will need to attach to the form.  Thanks.

## 2021-01-01 NOTE — Telephone Encounter (Signed)
I printed cxr, labs and OV note. Attached to form and placed in quick sign folder

## 2021-01-01 NOTE — Telephone Encounter (Signed)
Form placed in box

## 2021-01-14 ENCOUNTER — Other Ambulatory Visit: Payer: Self-pay | Admitting: Internal Medicine

## 2021-01-25 IMAGING — DX DG CHEST 1V PORT
1 series · 1 of 1 positions shown · non-contrast
Comparison: None.

CLINICAL DATA: Fever, fatigue

EXAM:
PORTABLE CHEST 1 VIEW

[chest ap]
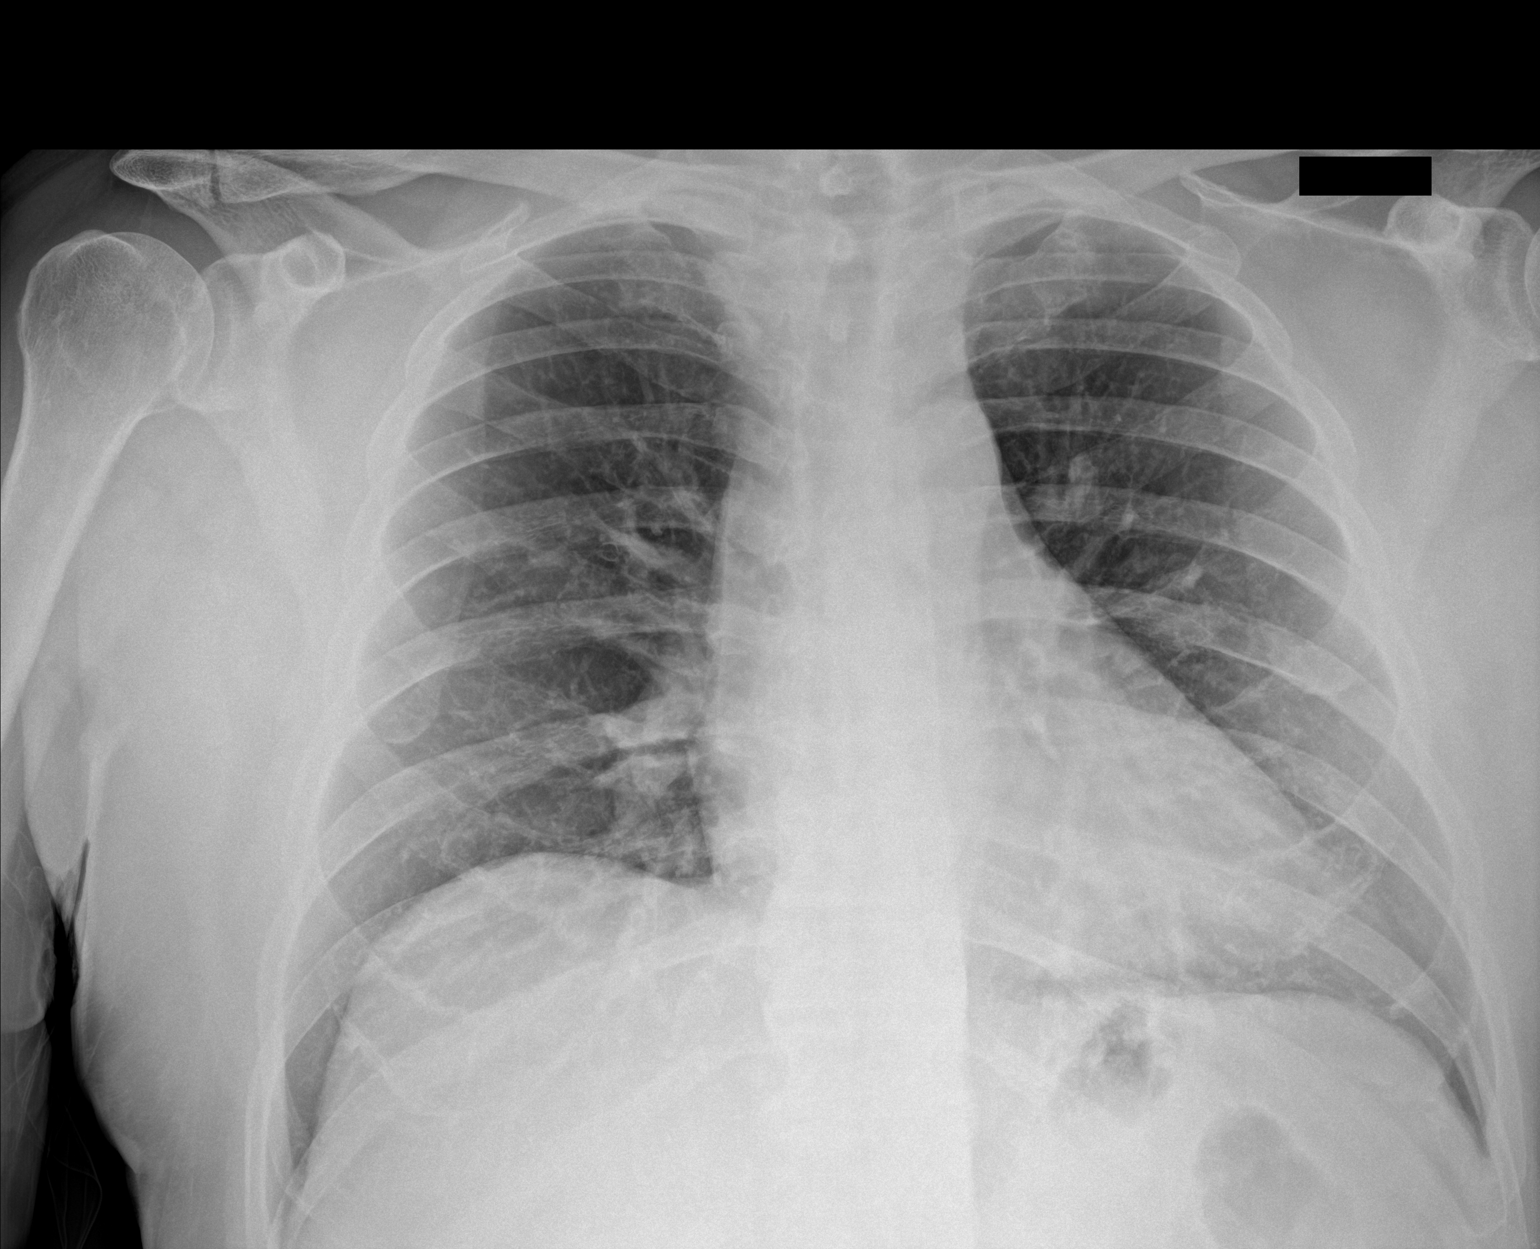

[1 of 1 positions shown; findings below may reference images not displayed]

FINDINGS: Heart and mediastinal contours are within normal limits. No focal
opacities or effusions. No acute bony abnormality.
IMPRESSION: No active disease.

## 2021-02-19 ENCOUNTER — Ambulatory Visit (INDEPENDENT_AMBULATORY_CARE_PROVIDER_SITE_OTHER): Payer: Medicare Other | Admitting: Internal Medicine

## 2021-02-19 ENCOUNTER — Other Ambulatory Visit: Payer: Self-pay

## 2021-02-19 DIAGNOSIS — I10 Essential (primary) hypertension: Secondary | ICD-10-CM

## 2021-02-19 DIAGNOSIS — R7989 Other specified abnormal findings of blood chemistry: Secondary | ICD-10-CM

## 2021-02-19 DIAGNOSIS — E1165 Type 2 diabetes mellitus with hyperglycemia: Secondary | ICD-10-CM

## 2021-02-19 DIAGNOSIS — E7800 Pure hypercholesterolemia, unspecified: Secondary | ICD-10-CM | POA: Diagnosis not present

## 2021-02-19 DIAGNOSIS — Z1159 Encounter for screening for other viral diseases: Secondary | ICD-10-CM

## 2021-02-19 DIAGNOSIS — R945 Abnormal results of liver function studies: Secondary | ICD-10-CM

## 2021-02-19 LAB — HEMOGLOBIN A1C: Hgb A1c MFr Bld: 6.4 % (ref 4.6–6.5)

## 2021-02-19 LAB — HEPATIC FUNCTION PANEL
ALT: 20 U/L (ref 0–53)
AST: 17 U/L (ref 0–37)
Albumin: 4.6 g/dL (ref 3.5–5.2)
Alkaline Phosphatase: 70 U/L (ref 39–117)
Bilirubin, Direct: 0.2 mg/dL (ref 0.0–0.3)
Total Bilirubin: 1.2 mg/dL (ref 0.2–1.2)
Total Protein: 6.9 g/dL (ref 6.0–8.3)

## 2021-02-19 LAB — LIPID PANEL
Cholesterol: 131 mg/dL (ref 0–200)
HDL: 34.2 mg/dL — ABNORMAL LOW (ref >39.00)
LDL Cholesterol: 70 mg/dL (ref 0–99)
NonHDL: 96.88
Total CHOL/HDL Ratio: 4
Triglycerides: 136 mg/dL (ref 0.0–149.0)
VLDL: 27.2 mg/dL (ref 0.0–40.0)

## 2021-02-19 LAB — BASIC METABOLIC PANEL
BUN: 21 mg/dL (ref 6–23)
CO2: 27 mEq/L (ref 19–32)
Calcium: 9.5 mg/dL (ref 8.4–10.5)
Chloride: 103 mEq/L (ref 96–112)
Creatinine, Ser: 1.12 mg/dL (ref 0.40–1.50)
GFR: 67.41 mL/min (ref >60.00)
Glucose, Bld: 134 mg/dL — ABNORMAL HIGH (ref 70–99)
Potassium: 4.1 mEq/L (ref 3.5–5.1)
Sodium: 139 mEq/L (ref 135–145)

## 2021-02-19 LAB — HM DIABETES FOOT EXAM

## 2021-02-19 NOTE — Progress Notes (Signed)
Patient ID: Jamie Martin, male   DOB: [birth year], 70 y.o.   MRN: [id number]   Subjective:    Patient ID: Jamie Martin, male    DOB: 1951-06-14, 70 y.o.   MRN: [id number]  HPI This visit occurred during the SARS-CoV-2 public health emergency.  Safety protocols were in place, including screening questions prior to the visit, additional usage of staff PPE, and extensive cleaning of exam room while observing appropriate contact time as indicated for disinfecting solutions.  Patient here for a scheduled follow up. Here to follow up regarding his blood pressure, cholesterol and blood sugar.  He is doing relatively well.  Last visit was started on crestor.  Feels he may be having more hand stiffness and leg aching since starting.  Legs better once he is up and moving.  No chest pain or sob reported.  No abdominal pain.  Bowels moving.  Had eyes check in January.  Discussed diabetes and low carb diet.  Right knee is doing better.  Off meloxicam.     Past Medical History:  Diagnosis Date  . Chicken pox   . History of hyperglycemia   . Hypertension   . Plantar fasciitis    History of   Past Surgical History:  Procedure Laterality Date  . arthroscopic knee     x2- Performed by Dr. by Leanor Kail  . FOOT SURGERY  2009  . KNEE ARTHROSCOPY  [id number]   right knee  . PLANTAR FASCIECTOMY  2012  . TONSILLECTOMY    . TONSILLECTOMY AND ADENOIDECTOMY     Family History  Problem Relation Age of Onset  . Hypertension Mother   . Arthritis Mother   . Stroke Father   . Colon cancer Neg Hx   . Prostate cancer Neg Hx    Social History   Socioeconomic History  . Marital status: Married    Spouse name: Not on file  . Number of children: Not on file  . Years of education: Not on file  . Highest education level: Not on file  Occupational History  . Not on file  Tobacco Use  . Smoking status: Former Smoker    Quit date: 11/17/2005    Years since quitting: 15.2  . Smokeless tobacco: Never  Used  Vaping Use  . Vaping Use: Never used  Substance and Sexual Activity  . Alcohol use: Yes    Alcohol/week: 0.0 standard drinks  . Drug use: No  . Sexual activity: Not on file  Other Topics Concern  . Not on file  Social History Narrative   Married and has 2 children.   Social Determinants of Health   Financial Resource Strain: Not on file  Food Insecurity: Not on file  Transportation Needs: Not on file  Physical Activity: Not on file  Stress: Not on file  Social Connections: Unknown  . Frequency of Communication with Friends and Family: Not on file  . Frequency of Social Gatherings with Friends and Family: More than three times a week  . Attends Religious Services: Not on file  . Active Member of Clubs or Organizations: Not on file  . Attends Archivist Meetings: Not on file  . Marital Status: Married    Outpatient Encounter Medications as of 02/19/2021  Medication Sig  . Aspirin 81 MG EC tablet Take 81 mg by mouth daily.  . bisoprolol-hydrochlorothiazide (ZIAC) 10-6.25 MG tablet TAKE 1 TABLET DAILY  . lisinopril-hydrochlorothiazide (ZESTORETIC) 10-12.5 MG tablet TAKE 1 TABLET DAILY  . meloxicam (  MOBIC) 15 MG tablet Take 15 mg by mouth daily as needed.  . OMEGA 3 1000 MG CAPS Take by mouth 2 (two) times daily.  . ondansetron (ZOFRAN ODT) 4 MG disintegrating tablet Take 1 tablet (4 mg total) by mouth every 8 (eight) hours as needed for nausea or vomiting.  . rosuvastatin (CRESTOR) 5 MG tablet Take 1 tablet (5 mg total) by mouth 3 (three) times a week.  . [DISCONTINUED] tadalafil (CIALIS) 5 MG tablet Take 1 tablet (5 mg total) by mouth daily as needed for erectile dysfunction.   No facility-administered encounter medications on file as of 02/19/2021.    Review of Systems  Constitutional: Negative for appetite change and unexpected weight change.  HENT: Negative for congestion and sinus pressure.   Respiratory: Negative for cough, chest tightness and shortness of  breath.   Cardiovascular: Negative for chest pain, palpitations and leg swelling.  Gastrointestinal: Negative for abdominal pain, diarrhea, nausea and vomiting.  Genitourinary: Negative for difficulty urinating and dysuria.  Musculoskeletal:       Hand stiffness.  Leg aching.   Skin: Negative for color change and rash.  Neurological: Negative for dizziness, light-headedness and headaches.  Psychiatric/Behavioral: Negative for agitation and dysphoric mood.       Objective:    Physical Exam Vitals reviewed.  Constitutional:      General: He is not in acute distress.    Appearance: Normal appearance. He is well-developed.  HENT:     Head: Normocephalic and atraumatic.     Right Ear: External ear normal.     Left Ear: External ear normal.  Eyes:     General: No scleral icterus.       Right eye: No discharge.        Left eye: No discharge.     Conjunctiva/sclera: Conjunctivae normal.  Cardiovascular:     Rate and Rhythm: Normal rate and regular rhythm.  Pulmonary:     Effort: Pulmonary effort is normal. No respiratory distress.     Breath sounds: Normal breath sounds.  Abdominal:     General: Bowel sounds are normal.     Palpations: Abdomen is soft.     Tenderness: There is no abdominal tenderness.  Musculoskeletal:        General: No swelling or tenderness.     Cervical back: Neck supple. No tenderness.  Lymphadenopathy:     Cervical: No cervical adenopathy.  Skin:    Findings: No erythema or rash.  Neurological:     Mental Status: He is alert.  Psychiatric:        Mood and Affect: Mood normal.        Behavior: Behavior normal.     BP 118/66   Pulse 66   Temp (!) 96.6 F (35.9 C) (Temporal)   Resp 16   Ht '5\' 7"'  (1.702 m)   Wt 233 lb (105.7 kg)   SpO2 98%   BMI 36.49 kg/m  Wt Readings from Last 3 Encounters:  02/19/21 233 lb (105.7 kg)  10/18/20 227 lb (103 kg)  06/13/20 (!) 224 lb 9.6 oz (101.9 kg)     Lab Results  Component Value Date   WBC 8.5  06/27/2020   HGB 16.5 06/27/2020   HCT 47.7 06/27/2020   PLT 245.0 06/27/2020   GLUCOSE 134 (H) 02/19/2021   CHOL 131 02/19/2021   TRIG 136.0 02/19/2021   HDL 34.20 (L) 02/19/2021   LDLDIRECT 103.9 05/03/2013   LDLCALC 70 02/19/2021   ALT 20 02/19/2021  AST 17 02/19/2021   NA 139 02/19/2021   K 4.1 02/19/2021   CL 103 02/19/2021   CREATININE 1.12 02/19/2021   BUN 21 02/19/2021   CO2 27 02/19/2021   TSH 1.48 06/27/2020   PSA 2.90 06/27/2020   INR 1.0 04/25/2011   HGBA1C 6.4 02/19/2021   MICROALBUR <0.7 06/27/2020    DG Chest Portable 1 View  Result Date: 07/26/2019 CLINICAL DATA:  Fever, fatigue EXAM: PORTABLE CHEST 1 VIEW COMPARISON:  None. FINDINGS: Heart and mediastinal contours are within normal limits. No focal opacities or effusions. No acute bony abnormality. IMPRESSION: No active disease. Electronically Signed   By: Rolm Baptise M.D.   On: 07/26/2019 21:12       Assessment & Plan:   Problem List Items Addressed This Visit    Abnormal liver function tests    Diet, exercise and weight loss.  Follow liver function tests.        Hypercholesterolemia    On crestor.  Noticed some hand stiffness and leg aching. Unclear if related to crestor.  Will temporarily hold.  Call with update.  Will need to treat cholesterol.  Continue low cholesterol diet and exercise.        Hypertension    Continue lisinopril/hctz and ziac.  Blood pressure doing well.  Follow met b and a1c.       Need for hepatitis C screening test    Check hepatitis C antibody.       Type 2 diabetes mellitus with hyperglycemia ([facility])    Low carb diet and exercise given elevated blood sugars.  Follow met b and a1c.   Lab Results  Component Value Date   HGBA1C 6.4 02/19/2021            Einar Pheasant, MD

## 2021-02-20 LAB — HEPATITIS C ANTIBODY
Hepatitis C Ab: NONREACTIVE
SIGNAL TO CUT-OFF: 0.01 (ref <1.00)

## 2021-02-24 ENCOUNTER — Encounter: Payer: Self-pay | Admitting: Internal Medicine

## 2021-02-24 NOTE — Assessment & Plan Note (Signed)
Continue lisinopril/hctz and ziac.  Blood pressure doing well.  Follow met b and a1c.

## 2021-02-24 NOTE — Assessment & Plan Note (Signed)
Low carb diet and exercise given elevated blood sugars.  Follow met b and a1c.   Lab Results  Component Value Date   HGBA1C 6.4 02/19/2021   

## 2021-02-24 NOTE — Assessment & Plan Note (Signed)
On crestor.  Noticed some hand stiffness and leg aching. Unclear if related to crestor.  Will temporarily hold.  Call with update.  Will need to treat cholesterol.  Continue low cholesterol diet and exercise.

## 2021-02-24 NOTE — Assessment & Plan Note (Signed)
Check hepatitis C antibody.

## 2021-02-24 NOTE — Assessment & Plan Note (Signed)
Diet, exercise and weight loss. Follow liver function tests.   

## 2021-03-19 DIAGNOSIS — M1711 Unilateral primary osteoarthritis, right knee: Secondary | ICD-10-CM | POA: Diagnosis not present

## 2021-04-08 ENCOUNTER — Encounter: Payer: Self-pay | Admitting: Internal Medicine

## 2021-04-08 NOTE — Telephone Encounter (Signed)
Please see if he would be agreeable to try a different statin medication.  If he has not tried pravastatin, see if agreeable to start pravastatin.  If so, then pravastatin 10mg  q Monday, Wednesday and Friday.  Will need liver panel checked 6 weeks after starting the pravastatin.

## 2021-05-14 ENCOUNTER — Telehealth: Payer: Self-pay | Admitting: Internal Medicine

## 2021-05-14 NOTE — Telephone Encounter (Signed)
Could you please review the coding on this patient's visit?

## 2021-05-14 NOTE — Telephone Encounter (Signed)
Patient called and said he received a bill for his 10/18/2020 "physical". Patient has Medicare and bcbs supplement, that did not cover visit.

## 2021-06-07 ENCOUNTER — Ambulatory Visit: Payer: Medicare Other

## 2021-06-14 ENCOUNTER — Ambulatory Visit: Payer: Medicare Other

## 2021-06-21 ENCOUNTER — Encounter: Payer: Self-pay | Admitting: Internal Medicine

## 2021-06-21 ENCOUNTER — Ambulatory Visit (INDEPENDENT_AMBULATORY_CARE_PROVIDER_SITE_OTHER): Payer: Medicare Other | Admitting: Internal Medicine

## 2021-06-21 ENCOUNTER — Other Ambulatory Visit: Payer: Self-pay

## 2021-06-21 DIAGNOSIS — R945 Abnormal results of liver function studies: Secondary | ICD-10-CM

## 2021-06-21 DIAGNOSIS — I10 Essential (primary) hypertension: Secondary | ICD-10-CM | POA: Diagnosis not present

## 2021-06-21 DIAGNOSIS — E1165 Type 2 diabetes mellitus with hyperglycemia: Secondary | ICD-10-CM

## 2021-06-21 DIAGNOSIS — M25561 Pain in right knee: Secondary | ICD-10-CM

## 2021-06-21 DIAGNOSIS — E7800 Pure hypercholesterolemia, unspecified: Secondary | ICD-10-CM

## 2021-06-21 DIAGNOSIS — R7989 Other specified abnormal findings of blood chemistry: Secondary | ICD-10-CM

## 2021-06-21 NOTE — Progress Notes (Signed)
Patient ID: Jamie Martin, male   DOB: 11-Oct-1951, 70 y.o.   MRN: [id number]   Subjective:    Patient ID: Jamie Martin, male    DOB: 01-31-1951, 70 y.o.   MRN: [id number]  HPI This visit occurred during the SARS-CoV-2 public health emergency.  Safety protocols were in place, including screening questions prior to the visit, additional usage of staff PPE, and extensive cleaning of exam room while observing appropriate contact time as indicated for disinfecting solutions.   Patient here for a scheduled follow up.  Here to follow-up regarding his blood sugar, blood pressure and cholesterol.  He is doing well.  Stays active.  No chest pain reported.  Breathing stable.  No increased cough or congestion.  No increased abdominal pain reported.  No bowel issues reported.  No sick contacts.  No fever.  No nausea or vomiting.  Status post right knee injection 03/20/2021.  This helped.  No significant problems now with his knee.  Blood pressure 128/79 at the dentist office recently.  Desires not to have blood drawn today.  States he will have his labs checked at his physical - next appt.   Past Medical History:  Diagnosis Date   Chicken pox    History of hyperglycemia    Hypertension    Plantar fasciitis    History of   Past Surgical History:  Procedure Laterality Date   arthroscopic knee     x2- Performed by Dr. by Leanor Kail   FOOT SURGERY  2009   KNEE ARTHROSCOPY  [id number]   right knee   PLANTAR FASCIECTOMY  2012   TONSILLECTOMY     TONSILLECTOMY AND ADENOIDECTOMY     Family History  Problem Relation Age of Onset   Hypertension Mother    Arthritis Mother    Stroke Father    Colon cancer Neg Hx    Prostate cancer Neg Hx    Social History   Socioeconomic History   Marital status: Married    Spouse name: Not on file   Number of children: Not on file   Years of education: Not on file   Highest education level: Not on file  Occupational History   Not on file  Tobacco Use    Smoking status: Former    Types: Cigarettes    Quit date: 11/17/2005    Years since quitting: 15.6   Smokeless tobacco: Never  Vaping Use   Vaping Use: Never used  Substance and Sexual Activity   Alcohol use: Yes    Alcohol/week: 0.0 standard drinks   Drug use: No   Sexual activity: Not on file  Other Topics Concern   Not on file  Social History Narrative   Married and has 2 children.   Social Determinants of Health   Financial Resource Strain: Not on file  Food Insecurity: Not on file  Transportation Needs: Not on file  Physical Activity: Not on file  Stress: Not on file  Social Connections: Not on file    Review of Systems  Constitutional:  Negative for appetite change and unexpected weight change.  HENT:  Negative for congestion and sinus pressure.   Respiratory:  Negative for cough, chest tightness and shortness of breath.   Cardiovascular:  Negative for chest pain, palpitations and leg swelling.  Gastrointestinal:  Negative for abdominal pain, diarrhea, nausea and vomiting.  Genitourinary:  Negative for difficulty urinating and dysuria.  Musculoskeletal:  Negative for joint swelling and myalgias.  Knee - better - s/p injection.   Skin:  Negative for color change and rash.  Neurological:  Negative for dizziness, light-headedness and headaches.  Psychiatric/Behavioral:  Negative for agitation and dysphoric mood.       Objective:    Physical Exam Constitutional:      General: He is not in acute distress.    Appearance: Normal appearance. He is well-developed.  HENT:     Head: Normocephalic and atraumatic.     Right Ear: External ear normal.     Left Ear: External ear normal.  Eyes:     General: No scleral icterus.       Right eye: No discharge.        Left eye: No discharge.  Cardiovascular:     Rate and Rhythm: Normal rate and regular rhythm.  Pulmonary:     Effort: Pulmonary effort is normal. No respiratory distress.     Breath sounds: Normal breath  sounds.  Abdominal:     General: Bowel sounds are normal.     Palpations: Abdomen is soft.     Tenderness: There is no abdominal tenderness.  Musculoskeletal:        General: No swelling or tenderness.     Cervical back: Neck supple. No tenderness.  Lymphadenopathy:     Cervical: No cervical adenopathy.  Skin:    Findings: No erythema or rash.  Neurological:     Mental Status: He is alert.  Psychiatric:        Mood and Affect: Mood normal.        Behavior: Behavior normal.    BP 132/84   Pulse 72   Temp (!) 95.4 F (35.2 C)   Ht 5' 7.01" (1.702 m)   Wt 232 lb 3.2 oz (105.3 kg)   SpO2 98%   BMI 36.36 kg/m  Wt Readings from Last 3 Encounters:  06/21/21 232 lb 3.2 oz (105.3 kg)  02/19/21 233 lb (105.7 kg)  10/18/20 227 lb (103 kg)    Outpatient Encounter Medications as of 06/21/2021  Medication Sig   Aspirin 81 MG EC tablet Take 81 mg by mouth daily.   bisoprolol-hydrochlorothiazide (ZIAC) 10-6.25 MG tablet TAKE 1 TABLET DAILY   lisinopril-hydrochlorothiazide (ZESTORETIC) 10-12.5 MG tablet TAKE 1 TABLET DAILY   meloxicam (MOBIC) 15 MG tablet Take 15 mg by mouth daily as needed.   OMEGA 3 1000 MG CAPS Take by mouth 2 (two) times daily.   ondansetron (ZOFRAN ODT) 4 MG disintegrating tablet Take 1 tablet (4 mg total) by mouth every 8 (eight) hours as needed for nausea or vomiting.   [DISCONTINUED] rosuvastatin (CRESTOR) 5 MG tablet Take 1 tablet (5 mg total) by mouth 3 (three) times a week. (Patient not taking: Reported on 06/21/2021)   No facility-administered encounter medications on file as of 06/21/2021.     Lab Results  Component Value Date   WBC 8.5 06/27/2020   HGB 16.5 06/27/2020   HCT 47.7 06/27/2020   PLT 245.0 06/27/2020   GLUCOSE 134 (H) 02/19/2021   CHOL 131 02/19/2021   TRIG 136.0 02/19/2021   HDL 34.20 (L) 02/19/2021   LDLDIRECT 103.9 05/03/2013   LDLCALC 70 02/19/2021   ALT 20 02/19/2021   AST 17 02/19/2021   NA 139 02/19/2021   K 4.1 02/19/2021   CL  103 02/19/2021   CREATININE 1.12 02/19/2021   BUN 21 02/19/2021   CO2 27 02/19/2021   TSH 1.48 06/27/2020   PSA 2.90 06/27/2020   INR 1.0  04/25/2011   HGBA1C 6.4 02/19/2021   MICROALBUR <0.7 06/27/2020    DG Chest Portable 1 View  Result Date: 07/26/2019 CLINICAL DATA:  Fever, fatigue EXAM: PORTABLE CHEST 1 VIEW COMPARISON:  None. FINDINGS: Heart and mediastinal contours are within normal limits. No focal opacities or effusions. No acute bony abnormality. IMPRESSION: No active disease. Electronically Signed   By: Rolm Baptise M.D.   On: 07/26/2019 21:12       Assessment & Plan:   Problem List Items Addressed This Visit     Abnormal liver function tests    Diet, exercise and weight loss.  Follow liver function tests.         Hyperbilirubinemia    Bilirubinemia wnl - 02/2021.         Hypercholesterolemia    Off crestor.  Stopped due to stiffness and aching.  Feels better off the medication.  Desires no statin therapy.  Low cholesterol diet and exercise.  Follow lipid panel and liver function tests.         Hypertension    Continue lisinopril/hctz and ziac.  Blood pressure doing well.  Follow met b and a1c.        Knee pain    S/p injection in 03/2021.  Knee is doing better.  Follow.        Type 2 diabetes mellitus with hyperglycemia (HCC)    Low carb diet and exercise given elevated blood sugars.  Follow met b and a1c.   Lab Results  Component Value Date   HGBA1C 6.4 02/19/2021         Einar Pheasant, MD

## 2021-06-22 ENCOUNTER — Encounter: Payer: Self-pay | Admitting: Internal Medicine

## 2021-06-22 NOTE — Assessment & Plan Note (Signed)
Off crestor.  Stopped due to stiffness and aching.  Feels better off the medication.  Desires no statin therapy.  Low cholesterol diet and exercise.  Follow lipid panel and liver function tests.

## 2021-06-22 NOTE — Assessment & Plan Note (Signed)
Bilirubinemia wnl - 02/2021.

## 2021-06-22 NOTE — Assessment & Plan Note (Signed)
S/p injection in 03/2021.  Knee is doing better.  Follow.

## 2021-06-22 NOTE — Assessment & Plan Note (Signed)
Diet, exercise and weight loss. Follow liver function tests.   

## 2021-06-22 NOTE — Assessment & Plan Note (Signed)
Continue lisinopril/hctz and ziac.  Blood pressure doing well.  Follow met b and a1c.  

## 2021-06-22 NOTE — Assessment & Plan Note (Signed)
Low carb diet and exercise given elevated blood sugars.  Follow met b and a1c.   Lab Results  Component Value Date   HGBA1C 6.4 02/19/2021   

## 2021-07-26 DIAGNOSIS — M1712 Unilateral primary osteoarthritis, left knee: Secondary | ICD-10-CM | POA: Diagnosis not present

## 2021-07-26 DIAGNOSIS — M1711 Unilateral primary osteoarthritis, right knee: Secondary | ICD-10-CM | POA: Diagnosis not present

## 2021-08-25 ENCOUNTER — Other Ambulatory Visit: Payer: Self-pay | Admitting: Internal Medicine

## 2021-09-24 DIAGNOSIS — L298 Other pruritus: Secondary | ICD-10-CM | POA: Diagnosis not present

## 2021-09-24 DIAGNOSIS — D224 Melanocytic nevi of scalp and neck: Secondary | ICD-10-CM | POA: Diagnosis not present

## 2021-09-24 DIAGNOSIS — L814 Other melanin hyperpigmentation: Secondary | ICD-10-CM | POA: Diagnosis not present

## 2021-09-24 DIAGNOSIS — L820 Inflamed seborrheic keratosis: Secondary | ICD-10-CM | POA: Diagnosis not present

## 2021-09-24 DIAGNOSIS — L538 Other specified erythematous conditions: Secondary | ICD-10-CM | POA: Diagnosis not present

## 2021-09-24 DIAGNOSIS — L821 Other seborrheic keratosis: Secondary | ICD-10-CM | POA: Diagnosis not present

## 2021-10-24 ENCOUNTER — Encounter: Payer: Self-pay | Admitting: Internal Medicine

## 2021-10-24 ENCOUNTER — Other Ambulatory Visit: Payer: Self-pay

## 2021-10-24 ENCOUNTER — Ambulatory Visit (INDEPENDENT_AMBULATORY_CARE_PROVIDER_SITE_OTHER): Payer: Medicare Other | Admitting: Internal Medicine

## 2021-10-24 VITALS — BP 132/78 | HR 69 | Temp 98.3°F | Resp 18 | Ht 67.0 in | Wt 236.1 lb

## 2021-10-24 DIAGNOSIS — E7800 Pure hypercholesterolemia, unspecified: Secondary | ICD-10-CM | POA: Diagnosis not present

## 2021-10-24 DIAGNOSIS — E1165 Type 2 diabetes mellitus with hyperglycemia: Secondary | ICD-10-CM

## 2021-10-24 DIAGNOSIS — Z125 Encounter for screening for malignant neoplasm of prostate: Secondary | ICD-10-CM

## 2021-10-24 DIAGNOSIS — I10 Essential (primary) hypertension: Secondary | ICD-10-CM | POA: Diagnosis not present

## 2021-10-24 DIAGNOSIS — R7989 Other specified abnormal findings of blood chemistry: Secondary | ICD-10-CM | POA: Diagnosis not present

## 2021-10-24 DIAGNOSIS — Z23 Encounter for immunization: Secondary | ICD-10-CM

## 2021-10-24 DIAGNOSIS — Z0000 Encounter for general adult medical examination without abnormal findings: Secondary | ICD-10-CM

## 2021-10-24 DIAGNOSIS — Z01818 Encounter for other preprocedural examination: Secondary | ICD-10-CM | POA: Insufficient documentation

## 2021-10-24 DIAGNOSIS — M25561 Pain in right knee: Secondary | ICD-10-CM

## 2021-10-24 LAB — BASIC METABOLIC PANEL
BUN: 20 mg/dL (ref 6–23)
CO2: 26 mEq/L (ref 19–32)
Calcium: 9.1 mg/dL (ref 8.4–10.5)
Chloride: 103 mEq/L (ref 96–112)
Creatinine, Ser: 1.16 mg/dL (ref 0.40–1.50)
GFR: 64.33 mL/min (ref >60.00)
Glucose, Bld: 133 mg/dL — ABNORMAL HIGH (ref 70–99)
Potassium: 4 mEq/L (ref 3.5–5.1)
Sodium: 138 mEq/L (ref 135–145)

## 2021-10-24 LAB — LIPID PANEL
Cholesterol: 146 mg/dL (ref 0–200)
HDL: 32.1 mg/dL — ABNORMAL LOW (ref >39.00)
LDL Cholesterol: 92 mg/dL (ref 0–99)
NonHDL: 114.3
Total CHOL/HDL Ratio: 5
Triglycerides: 114 mg/dL (ref 0.0–149.0)
VLDL: 22.8 mg/dL (ref 0.0–40.0)

## 2021-10-24 LAB — CBC WITH DIFFERENTIAL/PLATELET
Basophils Absolute: 0 10*3/uL (ref 0.0–0.1)
Basophils Relative: 0.4 % (ref 0.0–3.0)
Eosinophils Absolute: 0.2 10*3/uL (ref 0.0–0.7)
Eosinophils Relative: 3.7 % (ref 0.0–5.0)
HCT: 45.8 % (ref 39.0–52.0)
Hemoglobin: 15.5 g/dL (ref 13.0–17.0)
Lymphocytes Relative: 36.3 % (ref 12.0–46.0)
Lymphs Abs: 2.3 10*3/uL (ref 0.7–4.0)
MCHC: 34 g/dL (ref 30.0–36.0)
MCV: 95.8 fl (ref 78.0–100.0)
Monocytes Absolute: 0.7 10*3/uL (ref 0.1–1.0)
Monocytes Relative: 10.2 % (ref 3.0–12.0)
Neutro Abs: 3.2 10*3/uL (ref 1.4–7.7)
Neutrophils Relative %: 49.4 % (ref 43.0–77.0)
Platelets: 206 10*3/uL (ref 150.0–400.0)
RBC: 4.78 Mil/uL (ref 4.22–5.81)
RDW: 13.1 % (ref 11.5–15.5)
WBC: 6.5 10*3/uL (ref 4.0–10.5)

## 2021-10-24 LAB — TSH: TSH: 1.15 u[IU]/mL (ref 0.35–5.50)

## 2021-10-24 LAB — HEPATIC FUNCTION PANEL
ALT: 23 U/L (ref 0–53)
AST: 18 U/L (ref 0–37)
Albumin: 4.3 g/dL (ref 3.5–5.2)
Alkaline Phosphatase: 70 U/L (ref 39–117)
Bilirubin, Direct: 0.2 mg/dL (ref 0.0–0.3)
Total Bilirubin: 1 mg/dL (ref 0.2–1.2)
Total Protein: 6.5 g/dL (ref 6.0–8.3)

## 2021-10-24 LAB — PSA, MEDICARE: PSA: 2.93 ng/ml (ref 0.10–4.00)

## 2021-10-24 LAB — HEMOGLOBIN A1C: Hgb A1c MFr Bld: 6.4 % (ref 4.6–6.5)

## 2021-10-24 NOTE — Assessment & Plan Note (Addendum)
Remains off crestor.  Stopped due to stiffness and aching.  Feels better off the medication.  Desires no statin therapy.  Low cholesterol diet and exercise.  Discussed today. Follow lipid panel and liver function tests.

## 2021-10-24 NOTE — Assessment & Plan Note (Addendum)
Low carb diet and exercise given elevated blood sugars.  Follow met b and a1c.  Recheck today.  Lab Results  Component Value Date   HGBA1C 6.4 02/19/2021

## 2021-10-24 NOTE — Assessment & Plan Note (Addendum)
S/p injection 03/2021.  Planning for surgery as outlined.  See pro op evaluation for recommendations.

## 2021-10-24 NOTE — Assessment & Plan Note (Addendum)
Physical today 10/24/21.  Has declined colonoscopy and cologuard.  PSA 10/24/21 - 2.93.

## 2021-10-24 NOTE — Progress Notes (Signed)
Patient ID: Jamie Martin, male   DOB: 1951/06/28, 70 y.o.   MRN: [id number]   Subjective:    Patient ID: Jamie Martin, male    DOB: Jun 12, 1951, 70 y.o.   MRN: [id number]  This visit occurred during the SARS-CoV-2 public health emergency.  Safety protocols were in place, including screening questions prior to the visit, additional usage of staff PPE, and extensive cleaning of exam room while observing appropriate contact time as indicated for disinfecting solutions.     HPI With past history of hypertension, elevated sugar and cholesterol - he comes in today to follow up on these issues as well as for a complete physical exam.  He is doing well.  No chest pain or sob reported.  No abdominal pain reported.  S/p knee injection 03/20/21 and 07/2021. Injections have helped.  Planning knee surgery 11/06/21.  Knee does limit some of his physical activity.     Past Medical History:  Diagnosis Date   Chicken pox    History of hyperglycemia    Hypertension    Plantar fasciitis    History of   Past Surgical History:  Procedure Laterality Date   arthroscopic knee     x2- Performed by Dr. by Leanor Kail   FOOT SURGERY  2009   KNEE ARTHROSCOPY  [id number]   right knee   PLANTAR FASCIECTOMY  2012   TONSILLECTOMY     TONSILLECTOMY AND ADENOIDECTOMY     Family History  Problem Relation Age of Onset   Hypertension Mother    Arthritis Mother    Stroke Father    Colon cancer Neg Hx    Prostate cancer Neg Hx    Social History   Socioeconomic History   Marital status: Married    Spouse name: Not on file   Number of children: Not on file   Years of education: Not on file   Highest education level: Not on file  Occupational History   Not on file  Tobacco Use   Smoking status: Former    Types: Cigarettes    Quit date: 11/17/2005    Years since quitting: 15.9   Smokeless tobacco: Never  Vaping Use   Vaping Use: Never used  Substance and Sexual Activity   Alcohol use: Yes     Alcohol/week: 0.0 standard drinks   Drug use: No   Sexual activity: Not on file  Other Topics Concern   Not on file  Social History Narrative   Married and has 2 children.   Social Determinants of Health   Financial Resource Strain: Not on file  Food Insecurity: Not on file  Transportation Needs: Not on file  Physical Activity: Not on file  Stress: Not on file  Social Connections: Not on file     Review of Systems  Constitutional:  Negative for appetite change and unexpected weight change.  HENT:  Negative for congestion, sinus pressure and sore throat.   Eyes:  Negative for pain and visual disturbance.  Respiratory:  Negative for cough, chest tightness and shortness of breath.   Cardiovascular:  Negative for chest pain, palpitations and leg swelling.  Gastrointestinal:  Negative for abdominal pain, diarrhea, nausea and vomiting.  Genitourinary:  Negative for difficulty urinating and dysuria.  Musculoskeletal:  Negative for joint swelling and myalgias.       Knee pain as outlined.    Skin:  Negative for color change and rash.  Neurological:  Negative for dizziness, light-headedness and headaches.  Hematological:  Negative  for adenopathy. Does not bruise/bleed easily.  Psychiatric/Behavioral:  Negative for agitation and dysphoric mood.       Objective:     BP 132/78   Pulse 69   Temp 98.3 F (36.8 C) (Oral)   Resp 18   Ht '5\' 7"'  (1.702 m)   Wt 236 lb 2 oz (107.1 kg)   SpO2 98%   BMI 36.98 kg/m  Wt Readings from Last 3 Encounters:  10/24/21 236 lb 2 oz (107.1 kg)  06/21/21 232 lb 3.2 oz (105.3 kg)  02/19/21 233 lb (105.7 kg)    Physical Exam Constitutional:      General: He is not in acute distress.    Appearance: Normal appearance. He is well-developed.  HENT:     Head: Normocephalic and atraumatic.     Right Ear: External ear normal.     Left Ear: External ear normal.  Eyes:     General: No scleral icterus.       Right eye: No discharge.        Left  eye: No discharge.     Conjunctiva/sclera: Conjunctivae normal.  Neck:     Thyroid: No thyromegaly.  Cardiovascular:     Rate and Rhythm: Normal rate and regular rhythm.  Pulmonary:     Effort: No respiratory distress.     Breath sounds: Normal breath sounds. No wheezing.  Abdominal:     General: Bowel sounds are normal.     Palpations: Abdomen is soft.     Tenderness: There is no abdominal tenderness.  Musculoskeletal:        General: No swelling or tenderness.     Cervical back: Neck supple. No tenderness.  Lymphadenopathy:     Cervical: No cervical adenopathy.  Skin:    Findings: No erythema or rash.  Neurological:     Mental Status: He is alert and oriented to person, place, and time.  Psychiatric:        Mood and Affect: Mood normal.        Behavior: Behavior normal.     Outpatient Encounter Medications as of 10/24/2021  Medication Sig   Aspirin 81 MG EC tablet Take 81 mg by mouth daily.   bisoprolol-hydrochlorothiazide (ZIAC) 10-6.25 MG tablet TAKE 1 TABLET DAILY   lisinopril-hydrochlorothiazide (ZESTORETIC) 10-12.5 MG tablet TAKE 1 TABLET DAILY   meloxicam (MOBIC) 15 MG tablet Take 15 mg by mouth daily as needed.   OMEGA 3 1000 MG CAPS Take by mouth 2 (two) times daily.   ondansetron (ZOFRAN ODT) 4 MG disintegrating tablet Take 1 tablet (4 mg total) by mouth every 8 (eight) hours as needed for nausea or vomiting.   No facility-administered encounter medications on file as of 10/24/2021.     Lab Results  Component Value Date   WBC 6.5 10/24/2021   HGB 15.5 10/24/2021   HCT 45.8 10/24/2021   PLT 206.0 10/24/2021   GLUCOSE 133 (H) 10/24/2021   CHOL 146 10/24/2021   TRIG 114.0 10/24/2021   HDL 32.10 (L) 10/24/2021   LDLDIRECT 103.9 05/03/2013   LDLCALC 92 10/24/2021   ALT 23 10/24/2021   AST 18 10/24/2021   NA 138 10/24/2021   K 4.0 10/24/2021   CL 103 10/24/2021   CREATININE 1.16 10/24/2021   BUN 20 10/24/2021   CO2 26 10/24/2021   TSH 1.15 10/24/2021    PSA 2.93 10/24/2021   INR 1.0 04/25/2011   HGBA1C 6.4 10/24/2021   MICROALBUR <0.7 06/27/2020    DG Chest Portable  1 View  Result Date: 07/26/2019 CLINICAL DATA:  Fever, fatigue EXAM: PORTABLE CHEST 1 VIEW COMPARISON:  None. FINDINGS: Heart and mediastinal contours are within normal limits. No focal opacities or effusions. No acute bony abnormality. IMPRESSION: No active disease. Electronically Signed   By: Rolm Baptise M.D.   On: 07/26/2019 21:12       Assessment & Plan:   Problem List Items Addressed This Visit     Abnormal liver function tests    Diet, exercise and weight loss.  Follow liver function tests.        Health care maintenance    Physical today 10/24/21.  Has declined colonoscopy and cologuard.  PSA 10/24/21 - 2.93.        Hypercholesterolemia    Remains off crestor.  Stopped due to stiffness and aching.  Feels better off the medication.  Desires no statin therapy.  Low cholesterol diet and exercise.  Discussed today. Follow lipid panel and liver function tests.        Relevant Orders   CBC with Differential/Platelet (Completed)   Hepatic function panel (Completed)   Lipid panel (Completed)   TSH (Completed)   Hepatic function panel   Hypertension    Continue lisinopril/hctz and ziac.  Blood pressure has been doing well. Elevated initial check.  Recheck 132/78. Check met b today.        Relevant Orders   Basic metabolic panel (Completed)   Knee pain    S/p injection 03/2021.  Planning for surgery as outlined.  See pro op evaluation for recommendations.       Pre-op evaluation    Planning knee surgery.  EKG - Blood pressure on recheck improved.  EKG - SR/SB with ventricular rate 59.  No acute ischemic changes.  Given no chest pain or sob with increased activity or exertion and EKG findings, I feel he is at low risk to proceed with planned surgery.  He will need close intra op and post op monitoring of heart rate and blood pressure to avoid extremes.  They  instructed him to stop aspirin 5 days prior to surgery.  Will need to stop meloxicam as well.        Relevant Orders   EKG 12-Lead (Completed)   Type 2 diabetes mellitus with hyperglycemia (HCC)    Low carb diet and exercise given elevated blood sugars.  Follow met b and a1c.  Recheck today.  Lab Results  Component Value Date   HGBA1C 6.4 02/19/2021       Relevant Orders   Hemoglobin A1c (Completed)   Other Visit Diagnoses     Prostate cancer screening    -  Primary   Relevant Orders   PSA, Medicare (Completed)   Need for immunization against influenza       Relevant Orders   Flu Vaccine QUAD High Dose(Fluad) (Completed)        Einar Pheasant, MD

## 2021-10-24 NOTE — Assessment & Plan Note (Signed)
Diet, exercise and weight loss. Follow liver function tests.   

## 2021-10-24 NOTE — Assessment & Plan Note (Addendum)
Planning knee surgery.  EKG - Blood pressure on recheck improved.  EKG - SR/SB with ventricular rate 59.  No acute ischemic changes.  Given no chest pain or sob with increased activity or exertion and EKG findings, I feel he is at low risk to proceed with planned surgery.  He will need close intra op and post op monitoring of heart rate and blood pressure to avoid extremes.  They instructed him to stop aspirin 5 days prior to surgery.  Will need to stop meloxicam as well.

## 2021-10-24 NOTE — Assessment & Plan Note (Addendum)
Continue lisinopril/hctz and ziac.  Blood pressure has been doing well. Elevated initial check.  Recheck 132/78. Check met b today.

## 2021-10-27 ENCOUNTER — Encounter: Payer: Self-pay | Admitting: Internal Medicine

## 2021-11-04 ENCOUNTER — Other Ambulatory Visit: Payer: Medicare Other

## 2021-11-04 ENCOUNTER — Telehealth: Payer: Self-pay | Admitting: Internal Medicine

## 2021-11-04 DIAGNOSIS — R059 Cough, unspecified: Secondary | ICD-10-CM | POA: Diagnosis not present

## 2021-11-04 NOTE — Telephone Encounter (Signed)
Patient called in  advising that him and wife is  sick, it could be the flu, took Covid test negative . Patient  symptom fever Saturday 101 ache coughing , appetite change  have not eaten  but had little orange juice xfer  Access nurse

## 2021-11-04 NOTE — Telephone Encounter (Signed)
Spoke with patient. Sx started Friday. Cough, congestion, fever (101), decreased appetite but able to push fluids, achy all over. Grandkids were sick with some type of virus last week. Home covid test neg.  Patient confirmed no sob, chest tightness, etc. Fever has resolved. Patient has been scheduled for swab today and VV with pcp 12/20.

## 2021-11-05 ENCOUNTER — Telehealth (INDEPENDENT_AMBULATORY_CARE_PROVIDER_SITE_OTHER): Payer: Medicare Other | Admitting: Internal Medicine

## 2021-11-05 DIAGNOSIS — E7800 Pure hypercholesterolemia, unspecified: Secondary | ICD-10-CM | POA: Diagnosis not present

## 2021-11-05 DIAGNOSIS — I10 Essential (primary) hypertension: Secondary | ICD-10-CM

## 2021-11-05 DIAGNOSIS — J111 Influenza due to unidentified influenza virus with other respiratory manifestations: Secondary | ICD-10-CM | POA: Diagnosis not present

## 2021-11-05 NOTE — Assessment & Plan Note (Addendum)
The 10-year ASCVD risk score (Arnett DK, et al., 2019) is: 37%   Values used to calculate the score:     Age: 70 years     Sex: Male     Is Non-Hispanic African American: No     Diabetic: Yes     Tobacco smoker: No     Systolic Blood Pressure: 209 mmHg     Is BP treated: Yes     HDL Cholesterol: 32.1 mg/dL     Total Cholesterol: 146 mg/dL  Has declined cholesterol medication.

## 2021-11-05 NOTE — Progress Notes (Signed)
Patient ID: Jamie Martin, male   DOB: 12/07/1950, 70 y.o.   MRN: [id number]   Virtual Visit via video Note  This visit type was conducted due to national recommendations for restrictions regarding the COVID-19 pandemic (e.g. social distancing).  This format is felt to be most appropriate for this patient at this time.  All issues noted in this document were discussed and addressed.  No physical exam was performed (except for noted visual exam findings with Video Visits).   I connected with Jamie Martin today by a video enabled telemedicine application and verified that I am speaking with the correct person using two identifiers. Location patient: home Location provider: work Persons participating in the virtual visit: patient, provider  The limitations, risks, security and privacy concerns of performing an evaluation and management service by video and the availability of in person appointments have been discussed.  It has also been discussed with the patient that there may be a patient responsible charge related to this service. The patient expressed understanding and agreed to proceed.   Reason for visit: work in appt  HPI: Work in with concerns regarding fever, cough and congestion.  Flu test positive.  States symptoms started Friday - 11/01/21.  Stayed in bed Saturday.  Reported fever - tmax 101.  Decreased appetite.  Running nose.  Increased cough.  No headache or sore throat.  Had diarrhea.  Better now.  Some nausea.  No vomiting.  Ate today.  Minimal cough.  Pulse ox 96%.  Is feeling some better.  Flu swab - positive.     ROS: See pertinent positives and negatives per HPI.  Past Medical History:  Diagnosis Date   Chicken pox    History of hyperglycemia    Hypertension    Plantar fasciitis    History of    Past Surgical History:  Procedure Laterality Date   arthroscopic knee     x2- Performed by Dr. by Leanor Kail   FOOT SURGERY  2009   KNEE ARTHROSCOPY  [id number]    right knee   PLANTAR FASCIECTOMY  2012   TONSILLECTOMY     TONSILLECTOMY AND ADENOIDECTOMY      Family History  Problem Relation Age of Onset   Hypertension Mother    Arthritis Mother    Stroke Father    Colon cancer Neg Hx    Prostate cancer Neg Hx     SOCIAL HX: reviewed.    Current Outpatient Medications:    Aspirin 81 MG EC tablet, Take 81 mg by mouth daily., Disp: , Rfl:    bisoprolol-hydrochlorothiazide (ZIAC) 10-6.25 MG tablet, TAKE 1 TABLET DAILY, Disp: 90 tablet, Rfl: 1   lisinopril-hydrochlorothiazide (ZESTORETIC) 10-12.5 MG tablet, TAKE 1 TABLET DAILY, Disp: 90 tablet, Rfl: 1   meloxicam (MOBIC) 15 MG tablet, Take 15 mg by mouth daily as needed., Disp: , Rfl:    OMEGA 3 1000 MG CAPS, Take by mouth 2 (two) times daily., Disp: , Rfl:    ondansetron (ZOFRAN ODT) 4 MG disintegrating tablet, Take 1 tablet (4 mg total) by mouth every 8 (eight) hours as needed for nausea or vomiting., Disp: 20 tablet, Rfl: 0  EXAM:  VITALS per patient if applicable: pulse ox 99%  GENERAL: alert, oriented, appears well and in no acute distress  HEENT: atraumatic, conjunttiva clear, no obvious abnormalities on inspection of external nose and ears  NECK: normal movements of the head and neck  LUNGS: on inspection no signs of respiratory distress, breathing rate appears  normal, no obvious gross SOB, gasping or wheezing  CV: no obvious cyanosis  PSYCH/NEURO: pleasant and cooperative, no obvious depression or anxiety, speech and thought processing grossly intact  ASSESSMENT AND PLAN:  Discussed the following assessment and plan:  Problem List Items Addressed This Visit     Hypercholesterolemia    The 10-year ASCVD risk score (Arnett DK, et al., 2019) is: 37%   Values used to calculate the score:     Age: 13 years     Sex: Male     Is Non-Hispanic African American: No     Diabetic: Yes     Tobacco smoker: No     Systolic Blood Pressure: 473 mmHg     Is BP treated: Yes     HDL  Cholesterol: 32.1 mg/dL     Total Cholesterol: 146 mg/dL  Has declined cholesterol medication.       Hypertension    Continue lisinopril/hctz and ziac.  Follow met b.       Influenza    Symptoms as outlined.  Flu swab positive.  Discussed results.  Out of window for tamiflu.  Treat symptoms.  Discussed nasacort nasal spray and saline nasal spray as directed.  mucinex DM/robitussin DM as directed.  Rest.  Stay hydrated.  Eating.  Follow.  Call with update.        Return if symptoms worsen or fail to improve, for keep scheduled. .   I discussed the assessment and treatment plan with the patient. The patient was provided an opportunity to ask questions and all were answered. The patient agreed with the plan and demonstrated an understanding of the instructions.   The patient was advised to call back or seek an in-person evaluation if the symptoms worsen or if the condition fails to improve as anticipated.   Einar Pheasant, MD

## 2021-11-06 LAB — COVID-19, FLU A+B AND RSV
Influenza A, NAA: DETECTED — AB
Influenza B, NAA: NOT DETECTED
RSV, NAA: NOT DETECTED
SARS-CoV-2, NAA: NOT DETECTED

## 2021-11-14 DIAGNOSIS — I10 Essential (primary) hypertension: Secondary | ICD-10-CM | POA: Diagnosis not present

## 2021-11-14 DIAGNOSIS — M25761 Osteophyte, right knee: Secondary | ICD-10-CM | POA: Diagnosis not present

## 2021-11-14 DIAGNOSIS — E119 Type 2 diabetes mellitus without complications: Secondary | ICD-10-CM | POA: Diagnosis not present

## 2021-11-14 DIAGNOSIS — M25561 Pain in right knee: Secondary | ICD-10-CM | POA: Diagnosis not present

## 2021-11-14 DIAGNOSIS — M1711 Unilateral primary osteoarthritis, right knee: Secondary | ICD-10-CM | POA: Diagnosis not present

## 2021-11-14 DIAGNOSIS — E785 Hyperlipidemia, unspecified: Secondary | ICD-10-CM | POA: Diagnosis not present

## 2021-11-14 DIAGNOSIS — G8918 Other acute postprocedural pain: Secondary | ICD-10-CM | POA: Diagnosis not present

## 2021-11-15 ENCOUNTER — Encounter: Payer: Self-pay | Admitting: Internal Medicine

## 2021-11-15 DIAGNOSIS — E119 Type 2 diabetes mellitus without complications: Secondary | ICD-10-CM | POA: Diagnosis not present

## 2021-11-15 DIAGNOSIS — I10 Essential (primary) hypertension: Secondary | ICD-10-CM | POA: Diagnosis not present

## 2021-11-15 DIAGNOSIS — M1711 Unilateral primary osteoarthritis, right knee: Secondary | ICD-10-CM | POA: Diagnosis not present

## 2021-11-15 DIAGNOSIS — J111 Influenza due to unidentified influenza virus with other respiratory manifestations: Secondary | ICD-10-CM | POA: Insufficient documentation

## 2021-11-15 DIAGNOSIS — M25761 Osteophyte, right knee: Secondary | ICD-10-CM | POA: Diagnosis not present

## 2021-11-15 DIAGNOSIS — E785 Hyperlipidemia, unspecified: Secondary | ICD-10-CM | POA: Diagnosis not present

## 2021-11-15 NOTE — Assessment & Plan Note (Signed)
Continue lisinopril/hctz and ziac.  Follow met b.

## 2021-11-15 NOTE — Assessment & Plan Note (Signed)
Symptoms as outlined.  Flu swab positive.  Discussed results.  Out of window for tamiflu.  Treat symptoms.  Discussed nasacort nasal spray and saline nasal spray as directed.  mucinex DM/robitussin DM as directed.  Rest.  Stay hydrated.  Eating.  Follow.  Call with update.

## 2021-11-27 DIAGNOSIS — Z96651 Presence of right artificial knee joint: Secondary | ICD-10-CM | POA: Diagnosis not present

## 2021-11-27 DIAGNOSIS — H2513 Age-related nuclear cataract, bilateral: Secondary | ICD-10-CM | POA: Diagnosis not present

## 2021-12-02 DIAGNOSIS — Z96651 Presence of right artificial knee joint: Secondary | ICD-10-CM | POA: Diagnosis not present

## 2021-12-06 DIAGNOSIS — Z96651 Presence of right artificial knee joint: Secondary | ICD-10-CM | POA: Diagnosis not present

## 2021-12-11 DIAGNOSIS — Z96651 Presence of right artificial knee joint: Secondary | ICD-10-CM | POA: Diagnosis not present

## 2021-12-13 DIAGNOSIS — Z96651 Presence of right artificial knee joint: Secondary | ICD-10-CM | POA: Diagnosis not present

## 2021-12-15 ENCOUNTER — Encounter: Payer: Self-pay | Admitting: Internal Medicine

## 2021-12-16 DIAGNOSIS — Z96651 Presence of right artificial knee joint: Secondary | ICD-10-CM | POA: Diagnosis not present

## 2021-12-17 NOTE — Telephone Encounter (Signed)
Appt canceled!

## 2021-12-17 NOTE — Telephone Encounter (Signed)
I have messaged him regarding the crestor.  He will notify us if persistent problems.  Please cancel lab appt.

## 2021-12-18 ENCOUNTER — Other Ambulatory Visit: Payer: Medicare Other

## 2021-12-18 DIAGNOSIS — Z96651 Presence of right artificial knee joint: Secondary | ICD-10-CM | POA: Diagnosis not present

## 2021-12-23 DIAGNOSIS — Z96651 Presence of right artificial knee joint: Secondary | ICD-10-CM | POA: Diagnosis not present

## 2021-12-24 DIAGNOSIS — Z96651 Presence of right artificial knee joint: Secondary | ICD-10-CM | POA: Diagnosis not present

## 2021-12-25 DIAGNOSIS — Z96651 Presence of right artificial knee joint: Secondary | ICD-10-CM | POA: Diagnosis not present

## 2021-12-31 DIAGNOSIS — Z96651 Presence of right artificial knee joint: Secondary | ICD-10-CM | POA: Diagnosis not present

## 2022-01-03 DIAGNOSIS — Z96651 Presence of right artificial knee joint: Secondary | ICD-10-CM | POA: Diagnosis not present

## 2022-01-06 DIAGNOSIS — Z96651 Presence of right artificial knee joint: Secondary | ICD-10-CM | POA: Diagnosis not present

## 2022-01-08 DIAGNOSIS — Z96651 Presence of right artificial knee joint: Secondary | ICD-10-CM | POA: Diagnosis not present

## 2022-01-13 DIAGNOSIS — Z96651 Presence of right artificial knee joint: Secondary | ICD-10-CM | POA: Diagnosis not present

## 2022-01-15 DIAGNOSIS — Z96651 Presence of right artificial knee joint: Secondary | ICD-10-CM | POA: Diagnosis not present

## 2022-01-20 DIAGNOSIS — Z96651 Presence of right artificial knee joint: Secondary | ICD-10-CM | POA: Diagnosis not present

## 2022-01-22 DIAGNOSIS — Z96651 Presence of right artificial knee joint: Secondary | ICD-10-CM | POA: Diagnosis not present

## 2022-01-27 DIAGNOSIS — Z96651 Presence of right artificial knee joint: Secondary | ICD-10-CM | POA: Diagnosis not present

## 2022-01-28 ENCOUNTER — Other Ambulatory Visit: Payer: Self-pay | Admitting: Internal Medicine

## 2022-01-29 ENCOUNTER — Other Ambulatory Visit: Payer: Medicare Other

## 2022-01-31 DIAGNOSIS — Z96651 Presence of right artificial knee joint: Secondary | ICD-10-CM | POA: Diagnosis not present

## 2022-02-05 DIAGNOSIS — Z96651 Presence of right artificial knee joint: Secondary | ICD-10-CM | POA: Diagnosis not present

## 2022-02-26 ENCOUNTER — Ambulatory Visit (INDEPENDENT_AMBULATORY_CARE_PROVIDER_SITE_OTHER): Payer: Medicare Other | Admitting: Internal Medicine

## 2022-02-26 ENCOUNTER — Encounter: Payer: Self-pay | Admitting: Internal Medicine

## 2022-02-26 VITALS — BP 134/70 | HR 65 | Temp 98.7°F | Resp 12 | Ht 67.0 in | Wt 235.0 lb

## 2022-02-26 DIAGNOSIS — R7989 Other specified abnormal findings of blood chemistry: Secondary | ICD-10-CM | POA: Diagnosis not present

## 2022-02-26 DIAGNOSIS — E7800 Pure hypercholesterolemia, unspecified: Secondary | ICD-10-CM

## 2022-02-26 DIAGNOSIS — M25561 Pain in right knee: Secondary | ICD-10-CM | POA: Diagnosis not present

## 2022-02-26 DIAGNOSIS — I10 Essential (primary) hypertension: Secondary | ICD-10-CM | POA: Diagnosis not present

## 2022-02-26 DIAGNOSIS — E1165 Type 2 diabetes mellitus with hyperglycemia: Secondary | ICD-10-CM | POA: Diagnosis not present

## 2022-02-26 LAB — LIPID PANEL
Cholesterol: 161 mg/dL (ref 0–200)
HDL: 33.7 mg/dL — ABNORMAL LOW (ref >39.00)
LDL Cholesterol: 93 mg/dL (ref 0–99)
NonHDL: 127.79
Total CHOL/HDL Ratio: 5
Triglycerides: 175 mg/dL — ABNORMAL HIGH (ref 0.0–149.0)
VLDL: 35 mg/dL (ref 0.0–40.0)

## 2022-02-26 LAB — HEMOGLOBIN A1C: Hgb A1c MFr Bld: 6.4 % (ref 4.6–6.5)

## 2022-02-26 LAB — HEPATIC FUNCTION PANEL
ALT: 24 U/L (ref 0–53)
AST: 22 U/L (ref 0–37)
Albumin: 4.6 g/dL (ref 3.5–5.2)
Alkaline Phosphatase: 64 U/L (ref 39–117)
Bilirubin, Direct: 0.1 mg/dL (ref 0.0–0.3)
Total Bilirubin: 0.8 mg/dL (ref 0.2–1.2)
Total Protein: 6.7 g/dL (ref 6.0–8.3)

## 2022-02-26 LAB — BASIC METABOLIC PANEL
BUN: 21 mg/dL (ref 6–23)
CO2: 26 mEq/L (ref 19–32)
Calcium: 9.2 mg/dL (ref 8.4–10.5)
Chloride: 105 mEq/L (ref 96–112)
Creatinine, Ser: 1.09 mg/dL (ref 0.40–1.50)
GFR: 69.15 mL/min (ref >60.00)
Glucose, Bld: 116 mg/dL — ABNORMAL HIGH (ref 70–99)
Potassium: 4.1 mEq/L (ref 3.5–5.1)
Sodium: 141 mEq/L (ref 135–145)

## 2022-02-26 LAB — MICROALBUMIN / CREATININE URINE RATIO
Creatinine,U: 162.5 mg/dL
Microalb Creat Ratio: 0.8 mg/g (ref 0.0–30.0)
Microalb, Ur: 1.2 mg/dL (ref 0.0–1.9)

## 2022-02-26 NOTE — Progress Notes (Signed)
Patient ID: Jamie Martin, male   DOB: 14-Feb-1951, 71 y.o.   MRN: [id number] ? ? ?Subjective:  ? ? Patient ID: Jamie Martin, male    DOB: [birth year], 71 y.o.   MRN: [id number] ? ?This visit occurred during the SARS-CoV-2 public health emergency.  Safety protocols were in place, including screening questions prior to the visit, additional usage of staff PPE, and extensive cleaning of exam room while observing appropriate contact time as indicated for disinfecting solutions.  ? ?Patient here for a scheduled follow up.  ? ?Chief Complaint  ?Patient presents with  ? Follow-up  ?  4 mo f/u - hyperlipidemia and DM  ? .  ? ?HPI ?Doing well.  Just returned from [place] last night.  Stays active.  No chest pain or sob reported.  No abdominal pain or bowel change reported.  Does not eat a lot of sugar or carbs.  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  Went to PT.  Still some minimal swelling and numbness - around the knee cap.  Some pain with steps. Continues f/u with ortho.   ? ? ?Past Medical History:  ?Diagnosis Date  ? Chicken pox   ? History of hyperglycemia   ? Hypertension   ? Plantar fasciitis   ? History of  ? ?Past Surgical History:  ?Procedure Laterality Date  ? arthroscopic knee    ? x2- Performed by Dr. by Leanor Kail  ? FOOT SURGERY  2009  ? KNEE ARTHROSCOPY  2458,0998  ? right knee  ? PLANTAR FASCIECTOMY  2012  ? TONSILLECTOMY    ? TONSILLECTOMY AND ADENOIDECTOMY    ? ?Family History  ?Problem Relation Age of Onset  ? Hypertension Mother   ? Arthritis Mother   ? Stroke Father   ? Colon cancer Neg Hx   ? Prostate cancer Neg Hx   ? ?Social History  ? ?Socioeconomic History  ? Marital status: Married  ?  Spouse name: Not on file  ? Number of children: Not on file  ? Years of education: Not on file  ? Highest education level: Not on file  ?Occupational History  ? Not on file  ?Tobacco Use  ? Smoking status: Former  ?  Types: Cigarettes  ?  Quit date: 11/17/2005  ?  Years since quitting: 16.2  ? Smokeless tobacco: Never  ?Vaping Use   ? Vaping Use: Never used  ?Substance and Sexual Activity  ? Alcohol use: Yes  ?  Alcohol/week: 0.0 standard drinks  ? Drug use: No  ? Sexual activity: Not on file  ?Other Topics Concern  ? Not on file  ?Social History Narrative  ? Married and has 2 children.  ? ?Social Determinants of Health  ? ?Financial Resource Strain: Not on file  ?Food Insecurity: Not on file  ?Transportation Needs: Not on file  ?Physical Activity: Not on file  ?Stress: Not on file  ?Social Connections: Not on file  ? ? ? ?Review of Systems  ?Constitutional:  Negative for appetite change and unexpected weight change.  ?HENT:  Negative for congestion and sinus pressure.   ?Respiratory:  Negative for cough, chest tightness and shortness of breath.   ?Cardiovascular:  Negative for chest pain, palpitations and leg swelling.  ?Gastrointestinal:  Negative for abdominal pain, diarrhea, nausea and vomiting.  ?Genitourinary:  Negative for difficulty urinating and dysuria.  ?Musculoskeletal:  Negative for myalgias.  ?     S/p TKR as outlined.  Minimal swelling.   ?Skin:  Negative for color  change and rash.  ?Neurological:  Negative for dizziness, light-headedness and headaches.  ?Psychiatric/Behavioral:  Negative for agitation and dysphoric mood.   ? ?   ?Objective:  ?  ? ?BP 134/70 (BP Location: Left Arm, Patient Position: Sitting, Cuff Size: Large)   Pulse 65   Temp 98.7 ?F (37.1 ?C) (Temporal)   Resp 12   Ht _0  (1.702 m)   Wt 235 lb (106.6 kg)   SpO2 100%   BMI 36.81 kg/m?  ?Wt Readings from Last 3 Encounters:  ?02/26/22 235 lb (106.6 kg)  ?10/24/21 236 lb 2 oz (107.1 kg)  ?06/21/21 232 lb 3.2 oz (105.3 kg)  ? ? ?Physical Exam ?Constitutional:   ?   General: He is not in acute distress. ?   Appearance: Normal appearance. He is well-developed.  ?HENT:  ?   Head: Normocephalic and atraumatic.  ?   Right Ear: External ear normal.  ?   Left Ear: External ear normal.  ?Eyes:  ?   General: No scleral icterus.    ?   Right eye: No discharge.     ?    Left eye: No discharge.  ?Cardiovascular:  ?   Rate and Rhythm: Normal rate and regular rhythm.  ?Pulmonary:  ?   Effort: Pulmonary effort is normal. No respiratory distress.  ?   Breath sounds: Normal breath sounds.  ?Abdominal:  ?   General: Bowel sounds are normal.  ?   Palpations: Abdomen is soft.  ?   Tenderness: There is no abdominal tenderness.  ?Musculoskeletal:     ?   General: No tenderness.  ?   Cervical back: Neck supple. No tenderness.  ?   Comments: Right knee - well healed incision site.  Minimal soft tissue swelling.   ?Lymphadenopathy:  ?   Cervical: No cervical adenopathy.  ?Skin: ?   Findings: No erythema or rash.  ?Neurological:  ?   Mental Status: He is alert.  ?Psychiatric:     ?   Mood and Affect: Mood normal.     ?   Behavior: Behavior normal.  ? ? ? ?Outpatient Encounter Medications as of 02/26/2022  ?Medication Sig  ? Aspirin 81 MG EC tablet Take 81 mg by mouth daily.  ? bisoprolol-hydrochlorothiazide (ZIAC) 10-6.25 MG tablet TAKE 1 TABLET DAILY  ? lisinopril-hydrochlorothiazide (ZESTORETIC) 10-12.5 MG tablet TAKE 1 TABLET DAILY  ? meloxicam (MOBIC) 15 MG tablet Take 15 mg by mouth daily as needed.  ? ondansetron (ZOFRAN ODT) 4 MG disintegrating tablet Take 1 tablet (4 mg total) by mouth every 8 (eight) hours as needed for nausea or vomiting.  ? [DISCONTINUED] OMEGA 3 1000 MG CAPS Take by mouth 2 (two) times daily. (Patient not taking: Reported on 02/26/2022)  ? ?No facility-administered encounter medications on file as of 02/26/2022.  ?  ? ?Lab Results  ?Component Value Date  ? WBC 6.5 10/24/2021  ? HGB 15.5 10/24/2021  ? HCT 45.8 10/24/2021  ? PLT 206.0 10/24/2021  ? GLUCOSE 116 (H) 02/26/2022  ? CHOL 161 02/26/2022  ? TRIG 175.0 (H) 02/26/2022  ? HDL 33.70 (L) 02/26/2022  ? LDLDIRECT 103.9 05/03/2013  ? [place] 93 02/26/2022  ? ALT 24 02/26/2022  ? AST 22 02/26/2022  ? NA 141 02/26/2022  ? K 4.1 02/26/2022  ? CL 105 02/26/2022  ? CREATININE 1.09 02/26/2022  ? BUN 21 02/26/2022  ? CO2 26  02/26/2022  ? TSH 1.15 10/24/2021  ? PSA 2.93 10/24/2021  ? INR 1.0  04/25/2011  ? HGBA1C 6.4 02/26/2022  ? MICROALBUR 1.2 02/26/2022  ? ? ?DG Chest Portable 1 View ? ?Result Date: 07/26/2019 ?CLINICAL DATA:  Fever, fatigue EXAM: PORTABLE CHEST 1 VIEW COMPARISON:  None. FINDINGS: Heart and mediastinal contours are within normal limits. No focal opacities or effusions. No acute bony abnormality. IMPRESSION: No active disease. Electronically Signed   By: Rolm Baptise M.D.   On: 07/26/2019 21:12  ? ? ?   ?Assessment & Plan:  ? ?Problem List Items Addressed This Visit   ? ? Abnormal liver function tests  ?  Diet, exercise and weight loss.  Follow liver function tests.   ?  ?  ? Relevant Orders  ? Hepatic function panel (Completed)  ? Hypercholesterolemia  ?  The 10-year ASCVD risk score (Arnett DK, et al., 2019) is: 38.7% ?  Values used to calculate the score: ?    Age: 65 years ?    Sex: Male ?    Is Non-Hispanic African American: No ?    Diabetic: Yes ?    Tobacco smoker: No ?    Systolic Blood Pressure: 856 mmHg ?    Is BP treated: Yes ?    HDL Cholesterol: 33.7 mg/dL ?    Total Cholesterol: 161 mg/dL  ?Has declined cholesterol medication. Low cholesterol diet and exercise.  Recheck today.   ?  ?  ? Relevant Orders  ? Lipid Profile (Completed)  ? Hepatic function panel (Completed)  ? Hypertension - Primary  ?  Continue lisinopril/hctz and ziac.  Follow met b.  ?  ?  ? Relevant Orders  ? Basic Metabolic Panel (BMET) (Completed)  ? Knee pain  ?  S/p TKR.  Did well with surgery.  Still some pain - notices with stairs.  Continue f/u with ortho.  ?  ?  ? Type 2 diabetes mellitus with hyperglycemia (HCC)  ?  Low carb diet and exercise given elevated blood sugars.  Follow met b and a1c.  Recheck today.  ?Lab Results  ?Component Value Date  ? HGBA1C 6.4 02/26/2022  ?  ?  ? Relevant Orders  ? HgB A1c (Completed)  ? Basic Metabolic Panel (BMET) (Completed)  ? Microalbumin / creatinine urine ratio (Completed)  ? ? ? ?Einar Pheasant, MD  ?

## 2022-02-27 ENCOUNTER — Encounter: Payer: Self-pay | Admitting: Internal Medicine

## 2022-02-27 NOTE — Assessment & Plan Note (Signed)
S/p TKR.  Did well with surgery.  Still some pain - notices with stairs.  Continue f/u with ortho.  ?

## 2022-02-27 NOTE — Assessment & Plan Note (Signed)
Continue lisinopril/hctz and ziac.  Follow met b.  °

## 2022-02-27 NOTE — Assessment & Plan Note (Signed)
Diet, exercise and weight loss. Follow liver function tests.   

## 2022-02-27 NOTE — Assessment & Plan Note (Signed)
Low carb diet and exercise given elevated blood sugars.  Follow met b and a1c.  Recheck today.  ?Lab Results  ?Component Value Date  ? HGBA1C 6.4 02/26/2022  ? ?

## 2022-02-27 NOTE — Assessment & Plan Note (Signed)
The 10-year ASCVD risk score (Arnett DK, et al., 2019) is: 38.7% ?  Values used to calculate the score: ?    Age: 71 years ?    Sex: Male ?    Is Non-Hispanic African American: No ?    Diabetic: Yes ?    Tobacco smoker: No ?    Systolic Blood Pressure: 210 mmHg ?    Is BP treated: Yes ?    HDL Cholesterol: 33.7 mg/dL ?    Total Cholesterol: 161 mg/dL  ?Has declined cholesterol medication. Low cholesterol diet and exercise.  Recheck today.   ?

## 2022-03-03 ENCOUNTER — Telehealth: Payer: Self-pay | Admitting: Internal Medicine

## 2022-03-03 NOTE — Telephone Encounter (Signed)
Pt returning call to cma 

## 2022-03-05 NOTE — Telephone Encounter (Signed)
Lm for pt to cb.

## 2022-07-02 ENCOUNTER — Ambulatory Visit: Payer: Medicare Other | Admitting: Internal Medicine

## 2022-07-14 ENCOUNTER — Ambulatory Visit: Payer: Self-pay

## 2022-07-14 NOTE — Patient Outreach (Signed)
  Care Coordination   07/14/2022 Name: Jamie Martin MRN: [id number] DOB: May 13, 1951   Care Coordination Outreach Attempts:  An unsuccessful telephone outreach was attempted today to offer the patient information about available care coordination services as a benefit of their health plan.   Follow Up Plan:  Additional outreach attempts will be made to offer the patient care coordination information and services.   Encounter Outcome:  No Answer  Care Coordination Interventions Activated:  No   Care Coordination Interventions:  No, not indicated    Quinn Plowman RN,BSN,CCM RN Care Manager Coordinator [phone]

## 2022-07-31 ENCOUNTER — Ambulatory Visit (INDEPENDENT_AMBULATORY_CARE_PROVIDER_SITE_OTHER): Payer: Medicare Other | Admitting: Internal Medicine

## 2022-07-31 ENCOUNTER — Encounter: Payer: Self-pay | Admitting: Internal Medicine

## 2022-07-31 DIAGNOSIS — E1165 Type 2 diabetes mellitus with hyperglycemia: Secondary | ICD-10-CM | POA: Diagnosis not present

## 2022-07-31 DIAGNOSIS — R7989 Other specified abnormal findings of blood chemistry: Secondary | ICD-10-CM

## 2022-07-31 DIAGNOSIS — M25561 Pain in right knee: Secondary | ICD-10-CM

## 2022-07-31 DIAGNOSIS — I10 Essential (primary) hypertension: Secondary | ICD-10-CM | POA: Diagnosis not present

## 2022-07-31 LAB — HM DIABETES FOOT EXAM

## 2022-07-31 NOTE — Progress Notes (Signed)
fol

## 2022-07-31 NOTE — Progress Notes (Signed)
Patient ID: Jamie Martin, male   DOB: 07/22/1951, 71 y.o.   MRN: [id number]   Subjective:    Patient ID: Jamie Martin, male    DOB: 1951-02-20, 71 y.o.   MRN: [id number]   Patient here for  Chief Complaint  Patient presents with   Follow-up    4 mo   .   HPI Here to follow up regarding his blood pressure, blood sugar and cholesterol.  States he is doing relatively well.  Some right knee discomfort.  Tries to stay active.  No chest pain or sob reported.  No abdominal pain.  Bowels moving.     Past Medical History:  Diagnosis Date   Chicken pox    History of hyperglycemia    Hypertension    Plantar fasciitis    History of   Past Surgical History:  Procedure Laterality Date   arthroscopic knee     x2- Performed by Dr. by Leanor Kail   FOOT SURGERY  2009   KNEE ARTHROSCOPY  [id number]   right knee   PLANTAR FASCIECTOMY  2012   TONSILLECTOMY     TONSILLECTOMY AND ADENOIDECTOMY     Family History  Problem Relation Age of Onset   Hypertension Mother    Arthritis Mother    Stroke Father    Colon cancer Neg Hx    Prostate cancer Neg Hx    Social History   Socioeconomic History   Marital status: Married    Spouse name: Not on file   Number of children: Not on file   Years of education: Not on file   Highest education level: Not on file  Occupational History   Not on file  Tobacco Use   Smoking status: Former    Types: Cigarettes    Quit date: 11/17/2005    Years since quitting: 16.7   Smokeless tobacco: Never  Vaping Use   Vaping Use: Never used  Substance and Sexual Activity   Alcohol use: Yes    Alcohol/week: 0.0 standard drinks of alcohol   Drug use: No   Sexual activity: Not on file  Other Topics Concern   Not on file  Social History Narrative   Married and has 2 children.   Social Determinants of Health   Financial Resource Strain: Low Risk  (06/06/2019)   Overall Financial Resource Strain (CARDIA)    Difficulty of Paying Living Expenses: Not  hard at all  Food Insecurity: No Food Insecurity (06/06/2019)   Hunger Vital Sign    Worried About Running Out of Food in the Last Year: Never true    Ran Out of Food in the Last Year: Never true  Transportation Needs: No Transportation Needs (06/06/2019)   PRAPARE - Hydrologist (Medical): No    Lack of Transportation (Non-Medical): No  Physical Activity: Insufficiently Active (06/06/2019)   Exercise Vital Sign    Days of Exercise per Week: 3 days    Minutes of Exercise per Session: 20 min  Stress: No Stress Concern Present (06/06/2019)   [place]    Feeling of Stress : Not at all  Social Connections: Unknown (06/06/2020)   Social Connection and Isolation Panel [NHANES]    Frequency of Communication with Friends and Family: Not on file    Frequency of Social Gatherings with Friends and Family: More than three times a week    Attends Religious Services: Not on file  Active Member of Clubs or Organizations: Not on file    Attends Archivist Meetings: Not on file    Marital Status: Married     Review of Systems  Constitutional:  Negative for appetite change and unexpected weight change.  HENT:  Negative for congestion and sinus pressure.   Respiratory:  Negative for cough, chest tightness and shortness of breath.   Cardiovascular:  Negative for chest pain, palpitations and leg swelling.  Gastrointestinal:  Negative for abdominal pain, diarrhea, nausea and vomiting.  Genitourinary:  Negative for difficulty urinating and dysuria.  Musculoskeletal:  Negative for joint swelling and myalgias.  Skin:  Negative for color change and rash.  Neurological:  Negative for dizziness, light-headedness and headaches.  Psychiatric/Behavioral:  Negative for agitation and dysphoric mood.        Objective:     BP 120/70 (BP Location: Left Arm, Patient Position: Sitting, Cuff Size: Normal)    Pulse 71   Temp (!) 97.4 F (36.3 C) (Oral)   Ht '5\' 7"'  (1.702 m)   Wt 223 lb 3.2 oz (101.2 kg)   SpO2 98%   BMI 34.96 kg/m  Wt Readings from Last 3 Encounters:  07/31/22 223 lb 3.2 oz (101.2 kg)  02/26/22 235 lb (106.6 kg)  10/24/21 236 lb 2 oz (107.1 kg)    Physical Exam Constitutional:      General: He is not in acute distress.    Appearance: Normal appearance. He is well-developed.  HENT:     Head: Normocephalic and atraumatic.     Right Ear: External ear normal.     Left Ear: External ear normal.  Eyes:     General: No scleral icterus.       Right eye: No discharge.        Left eye: No discharge.  Cardiovascular:     Rate and Rhythm: Normal rate and regular rhythm.  Pulmonary:     Effort: Pulmonary effort is normal. No respiratory distress.     Breath sounds: Normal breath sounds.  Abdominal:     General: Bowel sounds are normal.     Palpations: Abdomen is soft.     Tenderness: There is no abdominal tenderness.  Musculoskeletal:        General: No swelling or tenderness.     Cervical back: Neck supple. No tenderness.  Lymphadenopathy:     Cervical: No cervical adenopathy.  Skin:    Findings: No erythema or rash.  Neurological:     Mental Status: He is alert.  Psychiatric:        Mood and Affect: Mood normal.        Behavior: Behavior normal.      Outpatient Encounter Medications as of 07/31/2022  Medication Sig   Aspirin 81 MG EC tablet Take 81 mg by mouth daily.   bisoprolol-hydrochlorothiazide (ZIAC) 10-6.25 MG tablet TAKE 1 TABLET DAILY   lisinopril-hydrochlorothiazide (ZESTORETIC) 10-12.5 MG tablet TAKE 1 TABLET DAILY   meloxicam (MOBIC) 15 MG tablet Take 15 mg by mouth daily as needed.   ondansetron (ZOFRAN ODT) 4 MG disintegrating tablet Take 1 tablet (4 mg total) by mouth every 8 (eight) hours as needed for nausea or vomiting.   No facility-administered encounter medications on file as of 07/31/2022.     Lab Results  Component Value Date   WBC  6.5 10/24/2021   HGB 15.5 10/24/2021   HCT 45.8 10/24/2021   PLT 206.0 10/24/2021   GLUCOSE 116 (H) 02/26/2022   CHOL 161  02/26/2022   TRIG 175.0 (H) 02/26/2022   HDL 33.70 (L) 02/26/2022   LDLDIRECT 103.9 05/03/2013   LDLCALC 93 02/26/2022   ALT 24 02/26/2022   AST 22 02/26/2022   NA 141 02/26/2022   K 4.1 02/26/2022   CL 105 02/26/2022   CREATININE 1.09 02/26/2022   BUN 21 02/26/2022   CO2 26 02/26/2022   TSH 1.15 10/24/2021   PSA 2.93 10/24/2021   INR 1.0 04/25/2011   HGBA1C 6.4 02/26/2022   MICROALBUR 1.2 02/26/2022    DG Chest Portable 1 View  Result Date: 07/26/2019 CLINICAL DATA:  Fever, fatigue EXAM: PORTABLE CHEST 1 VIEW COMPARISON:  None. FINDINGS: Heart and mediastinal contours are within normal limits. No focal opacities or effusions. No acute bony abnormality. IMPRESSION: No active disease. Electronically Signed   By: Rolm Baptise M.D.   On: 07/26/2019 21:12       Assessment & Plan:   Problem List Items Addressed This Visit     Abnormal liver function tests    Diet, exercise and weight loss.  Follow liver function tests.        Relevant Orders   Hepatic function panel   Hypertension    Continue lisinopril/hctz and ziac.  Follow met b.       Knee pain    S/p TKR.  Did well with surgery.  Still some pain. Continue f/u with ortho.       Type 2 diabetes mellitus with hyperglycemia (HCC)    Low carb diet and exercise given elevated blood sugars.  Follow met b and a1c.  Lab Results  Component Value Date   HGBA1C 6.4 02/26/2022       Relevant Orders   CBC with Differential/Platelet   Basic metabolic panel   Hemoglobin A1c   Lipid panel     Einar Pheasant, MD

## 2022-08-09 ENCOUNTER — Encounter: Payer: Self-pay | Admitting: Internal Medicine

## 2022-08-09 NOTE — Assessment & Plan Note (Signed)
S/p TKR.  Did well with surgery.  Still some pain. Continue f/u with ortho.

## 2022-08-09 NOTE — Assessment & Plan Note (Signed)
Diet, exercise and weight loss. Follow liver function tests.   

## 2022-08-09 NOTE — Assessment & Plan Note (Addendum)
Low carb diet and exercise given elevated blood sugars.  Follow met b and a1c.  Lab Results  Component Value Date   HGBA1C 6.4 02/26/2022

## 2022-08-09 NOTE — Assessment & Plan Note (Signed)
Continue lisinopril/hctz and ziac.  Follow met b.  °

## 2022-09-18 ENCOUNTER — Other Ambulatory Visit: Payer: Self-pay | Admitting: Family

## 2022-09-22 ENCOUNTER — Encounter: Payer: Self-pay | Admitting: Internal Medicine

## 2022-09-22 ENCOUNTER — Other Ambulatory Visit: Payer: Self-pay

## 2022-09-22 MED ORDER — BISOPROLOL-HYDROCHLOROTHIAZIDE 10-6.25 MG PO TABS: 1.0000 | ORAL_TABLET | Freq: Every day | ORAL | 1 refills | Status: DC

## 2022-09-22 MED ORDER — LISINOPRIL-HYDROCHLOROTHIAZIDE 10-12.5 MG PO TABS: 1.0000 | ORAL_TABLET | Freq: Every day | ORAL | 1 refills | Status: DC

## 2022-10-06 ENCOUNTER — Other Ambulatory Visit (INDEPENDENT_AMBULATORY_CARE_PROVIDER_SITE_OTHER): Payer: Medicare Other

## 2022-10-06 DIAGNOSIS — E1165 Type 2 diabetes mellitus with hyperglycemia: Secondary | ICD-10-CM | POA: Diagnosis not present

## 2022-10-06 DIAGNOSIS — R7989 Other specified abnormal findings of blood chemistry: Secondary | ICD-10-CM | POA: Diagnosis not present

## 2022-10-06 DIAGNOSIS — E7800 Pure hypercholesterolemia, unspecified: Secondary | ICD-10-CM

## 2022-10-06 LAB — CBC WITH DIFFERENTIAL/PLATELET
Basophils Absolute: 0 10*3/uL (ref 0.0–0.1)
Basophils Relative: 0.6 % (ref 0.0–3.0)
Eosinophils Absolute: 0.3 10*3/uL (ref 0.0–0.7)
Eosinophils Relative: 3.9 % (ref 0.0–5.0)
HCT: 47.4 % (ref 39.0–52.0)
Hemoglobin: 16.2 g/dL (ref 13.0–17.0)
Lymphocytes Relative: 39.4 % (ref 12.0–46.0)
Lymphs Abs: 2.9 10*3/uL (ref 0.7–4.0)
MCHC: 34.2 g/dL (ref 30.0–36.0)
MCV: 95.8 fl (ref 78.0–100.0)
Monocytes Absolute: 0.7 10*3/uL (ref 0.1–1.0)
Monocytes Relative: 9.3 % (ref 3.0–12.0)
Neutro Abs: 3.5 10*3/uL (ref 1.4–7.7)
Neutrophils Relative %: 46.8 % (ref 43.0–77.0)
Platelets: 261 10*3/uL (ref 150.0–400.0)
RBC: 4.95 Mil/uL (ref 4.22–5.81)
RDW: 13.2 % (ref 11.5–15.5)
WBC: 7.5 10*3/uL (ref 4.0–10.5)

## 2022-10-06 LAB — LIPID PANEL
Cholesterol: 144 mg/dL (ref 0–200)
HDL: 30.6 mg/dL — ABNORMAL LOW (ref >39.00)
LDL Cholesterol: 82 mg/dL (ref 0–99)
NonHDL: 113.48
Total CHOL/HDL Ratio: 5
Triglycerides: 156 mg/dL — ABNORMAL HIGH (ref 0.0–149.0)
VLDL: 31.2 mg/dL (ref 0.0–40.0)

## 2022-10-06 LAB — BASIC METABOLIC PANEL
BUN: 20 mg/dL (ref 6–23)
CO2: 27 mEq/L (ref 19–32)
Calcium: 9.2 mg/dL (ref 8.4–10.5)
Chloride: 103 mEq/L (ref 96–112)
Creatinine, Ser: 1.12 mg/dL (ref 0.40–1.50)
GFR: 66.65 mL/min (ref >60.00)
Glucose, Bld: 117 mg/dL — ABNORMAL HIGH (ref 70–99)
Potassium: 4.2 mEq/L (ref 3.5–5.1)
Sodium: 137 mEq/L (ref 135–145)

## 2022-10-06 LAB — HEMOGLOBIN A1C: Hgb A1c MFr Bld: 6.4 % (ref 4.6–6.5)

## 2022-10-06 LAB — HEPATIC FUNCTION PANEL
ALT: 25 U/L (ref 0–53)
AST: 19 U/L (ref 0–37)
Albumin: 4.4 g/dL (ref 3.5–5.2)
Alkaline Phosphatase: 56 U/L (ref 39–117)
Bilirubin, Direct: 0.3 mg/dL (ref 0.0–0.3)
Total Bilirubin: 1.6 mg/dL — ABNORMAL HIGH (ref 0.2–1.2)
Total Protein: 6.5 g/dL (ref 6.0–8.3)

## 2022-10-07 ENCOUNTER — Other Ambulatory Visit: Payer: Self-pay

## 2022-12-02 ENCOUNTER — Ambulatory Visit (INDEPENDENT_AMBULATORY_CARE_PROVIDER_SITE_OTHER): Payer: Medicare Other | Admitting: Internal Medicine

## 2022-12-02 ENCOUNTER — Encounter: Payer: Self-pay | Admitting: Internal Medicine

## 2022-12-02 DIAGNOSIS — I10 Essential (primary) hypertension: Secondary | ICD-10-CM | POA: Diagnosis not present

## 2022-12-02 DIAGNOSIS — Z125 Encounter for screening for malignant neoplasm of prostate: Secondary | ICD-10-CM | POA: Diagnosis not present

## 2022-12-02 DIAGNOSIS — E1165 Type 2 diabetes mellitus with hyperglycemia: Secondary | ICD-10-CM

## 2022-12-02 DIAGNOSIS — R7989 Other specified abnormal findings of blood chemistry: Secondary | ICD-10-CM | POA: Diagnosis not present

## 2022-12-02 DIAGNOSIS — Z0000 Encounter for general adult medical examination without abnormal findings: Secondary | ICD-10-CM

## 2022-12-02 DIAGNOSIS — M25561 Pain in right knee: Secondary | ICD-10-CM

## 2022-12-02 LAB — BASIC METABOLIC PANEL
BUN: 20 mg/dL (ref 6–23)
CO2: 28 mEq/L (ref 19–32)
Calcium: 9.6 mg/dL (ref 8.4–10.5)
Chloride: 101 mEq/L (ref 96–112)
Creatinine, Ser: 1.2 mg/dL (ref 0.40–1.50)
GFR: 61.29 mL/min (ref >60.00)
Glucose, Bld: 148 mg/dL — ABNORMAL HIGH (ref 70–99)
Potassium: 4.2 mEq/L (ref 3.5–5.1)
Sodium: 138 mEq/L (ref 135–145)

## 2022-12-02 LAB — HEPATIC FUNCTION PANEL
ALT: 19 U/L (ref 0–53)
AST: 18 U/L (ref 0–37)
Albumin: 4.7 g/dL (ref 3.5–5.2)
Alkaline Phosphatase: 69 U/L (ref 39–117)
Bilirubin, Direct: 0.2 mg/dL (ref 0.0–0.3)
Total Bilirubin: 1.4 mg/dL — ABNORMAL HIGH (ref 0.2–1.2)
Total Protein: 7 g/dL (ref 6.0–8.3)

## 2022-12-02 LAB — PSA, MEDICARE: PSA: 3.43 ng/ml (ref 0.10–4.00)

## 2022-12-02 MED ORDER — LISINOPRIL-HYDROCHLOROTHIAZIDE 10-12.5 MG PO TABS: 1.0000 | ORAL_TABLET | Freq: Every day | ORAL | 1 refills | Status: DC

## 2022-12-02 MED ORDER — BISOPROLOL-HYDROCHLOROTHIAZIDE 10-6.25 MG PO TABS: 1.0000 | ORAL_TABLET | Freq: Every day | ORAL | 1 refills | Status: DC

## 2022-12-02 NOTE — Assessment & Plan Note (Signed)
Diet, exercise and weight loss.  Follow liver function tests.  Bilirubin elevated recent check.  Recheck liver panel today.

## 2022-12-02 NOTE — Assessment & Plan Note (Signed)
Recheck liver panel today.

## 2022-12-02 NOTE — Assessment & Plan Note (Signed)
S/p TKR.  Did well with surgery.  Still some pain. Continue f/u with ortho. Has f/u next week.

## 2022-12-02 NOTE — Assessment & Plan Note (Signed)
Low carb diet and exercise given elevated blood sugars.  Follow met b and a1c.  Lab Results  Component Value Date   HGBA1C 6.4 10/06/2022

## 2022-12-02 NOTE — Assessment & Plan Note (Addendum)
Physical today 12/02/22.  Has declined colonoscopy and cologuard.  PSA 10/24/21 - 2.93.  Recheck psa today.

## 2022-12-02 NOTE — Progress Notes (Signed)
Subjective:    Patient ID: Jamie Martin, male    DOB: 05/09/1951, 72 y.o.   MRN: [id number]  Patient here for  Chief Complaint  Patient presents with   Annual Exam    HPI With past history of diabetes, hypercholesterolemia and hypertension.  He comes in today to follow-up onthese issues as well as for complete physical exam.  Reports he is doing well.  He is still having some right knee issues status post his surgery.  Mostly notices stiffness with going up and down stairs.  Some discomfort after he has been sitting for a while and then goes to get up.  This resolves as he starts moving around.  No chest pain, tightness or shortness of breath.  No nausea or vomiting.  No abdominal pain or cramping.  No bowel change reported.  Discussed need for colonoscopy.  He declines colon cancer screening.         Past Medical History:  Diagnosis Date   Chicken pox    History of hyperglycemia    Hypertension    Plantar fasciitis    History of   Past Surgical History:  Procedure Laterality Date   arthroscopic knee     x2- Performed by Dr. by Leanor Kail   FOOT SURGERY  2009   KNEE ARTHROSCOPY  [id number]   right knee   PLANTAR FASCIECTOMY  2012   TONSILLECTOMY     TONSILLECTOMY AND ADENOIDECTOMY     Family History  Problem Relation Age of Onset   Hypertension Mother    Arthritis Mother    Stroke Father    Colon cancer Neg Hx    Prostate cancer Neg Hx    Social History   Socioeconomic History   Marital status: Married    Spouse name: Not on file   Number of children: Not on file   Years of education: Not on file   Highest education level: Not on file  Occupational History   Not on file  Tobacco Use   Smoking status: Former    Types: Cigarettes    Quit date: 11/17/2005    Years since quitting: 17.0   Smokeless tobacco: Never  Vaping Use   Vaping Use: Never used  Substance and Sexual Activity   Alcohol use: Yes    Alcohol/week: 0.0 standard drinks of alcohol    Drug use: No   Sexual activity: Not on file  Other Topics Concern   Not on file  Social History Narrative   Married and has 2 children.   Social Determinants of Health   Financial Resource Strain: Low Risk  (06/06/2019)   Overall Financial Resource Strain (CARDIA)    Difficulty of Paying Living Expenses: Not hard at all  Food Insecurity: No Food Insecurity (06/06/2019)   Hunger Vital Sign    Worried About Running Out of Food in the Last Year: Never true    Ran Out of Food in the Last Year: Never true  Transportation Needs: No Transportation Needs (06/06/2019)   PRAPARE - Hydrologist (Medical): No    Lack of Transportation (Non-Medical): No  Physical Activity: Insufficiently Active (06/06/2019)   Exercise Vital Sign    Days of Exercise per Week: 3 days    Minutes of Exercise per Session: 20 min  Stress: No Stress Concern Present (06/06/2019)   [place]    Feeling of Stress : Not at all  Social Connections: Unknown (  06/06/2020)   Social Connection and Isolation Panel [NHANES]    Frequency of Communication with Friends and Family: Not on file    Frequency of Social Gatherings with Friends and Family: More than three times a week    Attends Religious Services: Not on file    Active Member of Clubs or Organizations: Not on file    Attends Archivist Meetings: Not on file    Marital Status: Married     Review of Systems  Constitutional:  Negative for appetite change and unexpected weight change.  HENT:  Negative for congestion, sinus pressure and sore throat.   Eyes:  Negative for pain and visual disturbance.  Respiratory:  Negative for cough, chest tightness and shortness of breath.   Cardiovascular:  Negative for chest pain, palpitations and leg swelling.  Gastrointestinal:  Negative for abdominal pain, diarrhea, nausea and vomiting.  Genitourinary:  Negative for difficulty  urinating and dysuria.  Musculoskeletal:  Negative for back pain and myalgias.       Knee pain as outlined.   Skin:  Negative for color change and rash.  Neurological:  Negative for dizziness and headaches.  Hematological:  Negative for adenopathy. Does not bruise/bleed easily.  Psychiatric/Behavioral:  Negative for agitation and dysphoric mood.        Objective:     BP 126/72 (BP Location: Left Arm, Patient Position: Sitting, Cuff Size: Normal)   Pulse 70   Temp 98 F (36.7 C)   Resp 16   Ht '5\' 7"'$  (1.702 m)   Wt 228 lb (103.4 kg)   SpO2 97%   BMI 35.71 kg/m  Wt Readings from Last 3 Encounters:  12/02/22 228 lb (103.4 kg)  07/31/22 223 lb 3.2 oz (101.2 kg)  02/26/22 235 lb (106.6 kg)    Physical Exam Constitutional:      General: He is not in acute distress.    Appearance: Normal appearance. He is well-developed.  HENT:     Head: Normocephalic and atraumatic.     Right Ear: External ear normal.     Left Ear: External ear normal.  Eyes:     General: No scleral icterus.       Right eye: No discharge.        Left eye: No discharge.     Conjunctiva/sclera: Conjunctivae normal.  Neck:     Thyroid: No thyromegaly.  Cardiovascular:     Rate and Rhythm: Normal rate and regular rhythm.  Pulmonary:     Effort: No respiratory distress.     Breath sounds: Normal breath sounds. No wheezing.  Abdominal:     General: Bowel sounds are normal.     Palpations: Abdomen is soft.     Tenderness: There is no abdominal tenderness.  Musculoskeletal:        General: No swelling or tenderness.     Cervical back: Neck supple. No tenderness.  Lymphadenopathy:     Cervical: No cervical adenopathy.  Skin:    Findings: No erythema or rash.  Neurological:     Mental Status: He is alert and oriented to person, place, and time.  Psychiatric:        Mood and Affect: Mood normal.        Behavior: Behavior normal.      Outpatient Encounter Medications as of 12/02/2022  Medication Sig    Aspirin 81 MG EC tablet Take 81 mg by mouth daily.   bisoprolol-hydrochlorothiazide (ZIAC) 10-6.25 MG tablet Take 1 tablet by mouth daily.  lisinopril-hydrochlorothiazide (ZESTORETIC) 10-12.5 MG tablet Take 1 tablet by mouth daily.   meloxicam (MOBIC) 15 MG tablet Take 15 mg by mouth daily as needed.   ondansetron (ZOFRAN ODT) 4 MG disintegrating tablet Take 1 tablet (4 mg total) by mouth every 8 (eight) hours as needed for nausea or vomiting.   [DISCONTINUED] bisoprolol-hydrochlorothiazide (ZIAC) 10-6.25 MG tablet Take 1 tablet by mouth daily.   [DISCONTINUED] lisinopril-hydrochlorothiazide (ZESTORETIC) 10-12.5 MG tablet Take 1 tablet by mouth daily.   No facility-administered encounter medications on file as of 12/02/2022.     Lab Results  Component Value Date   WBC 7.5 10/06/2022   HGB 16.2 10/06/2022   HCT 47.4 10/06/2022   PLT 261.0 10/06/2022   GLUCOSE 117 (H) 10/06/2022   CHOL 144 10/06/2022   TRIG 156.0 (H) 10/06/2022   HDL 30.60 (L) 10/06/2022   LDLDIRECT 103.9 05/03/2013   LDLCALC 82 10/06/2022   ALT 25 10/06/2022   AST 19 10/06/2022   NA 137 10/06/2022   K 4.2 10/06/2022   CL 103 10/06/2022   CREATININE 1.12 10/06/2022   BUN 20 10/06/2022   CO2 27 10/06/2022   TSH 1.15 10/24/2021   PSA 2.93 10/24/2021   INR 1.0 04/25/2011   HGBA1C 6.4 10/06/2022   MICROALBUR 1.2 02/26/2022    DG Chest Portable 1 View  Result Date: 07/26/2019 CLINICAL DATA:  Fever, fatigue EXAM: PORTABLE CHEST 1 VIEW COMPARISON:  None. FINDINGS: Heart and mediastinal contours are within normal limits. No focal opacities or effusions. No acute bony abnormality. IMPRESSION: No active disease. Electronically Signed   By: Rolm Baptise M.D.   On: 07/26/2019 21:12       Assessment & Plan:  Prostate cancer screening -     PSA, Medicare  Health care maintenance Assessment & Plan: Physical today 12/02/22.  Has declined colonoscopy and cologuard.  PSA 10/24/21 - 2.93.  Recheck psa today.     Abnormal liver function tests Assessment & Plan: Diet, exercise and weight loss.  Follow liver function tests.  Bilirubin elevated recent check.  Recheck liver panel today.   Orders: -     Hepatic function panel  Hyperbilirubinemia Assessment & Plan: Recheck liver panel today.    Primary hypertension Assessment & Plan: Continue lisinopril/hctz and ziac.  Follow met b.    Right knee pain, unspecified chronicity Assessment & Plan: S/p TKR.  Did well with surgery.  Still some pain. Continue f/u with ortho. Has f/u next week.    Type 2 diabetes mellitus with hyperglycemia, without long-term current use of insulin (HCC) Assessment & Plan: Low carb diet and exercise given elevated blood sugars.  Follow met b and a1c.  Lab Results  Component Value Date   HGBA1C 6.4 10/06/2022    Orders: -     Basic metabolic panel  Other orders -     Bisoprolol-hydroCHLOROthiazide; Take 1 tablet by mouth daily.  Dispense: 90 tablet; Refill: 1 -     Lisinopril-hydroCHLOROthiazide; Take 1 tablet by mouth daily.  Dispense: 90 tablet; Refill: 1     Einar Pheasant, MD

## 2022-12-02 NOTE — Assessment & Plan Note (Signed)
Continue lisinopril/hctz and ziac.  Follow met b.  °

## 2022-12-03 ENCOUNTER — Other Ambulatory Visit: Payer: Self-pay

## 2022-12-03 DIAGNOSIS — N289 Disorder of kidney and ureter, unspecified: Secondary | ICD-10-CM

## 2022-12-03 DIAGNOSIS — H2513 Age-related nuclear cataract, bilateral: Secondary | ICD-10-CM | POA: Diagnosis not present

## 2022-12-05 DIAGNOSIS — Z96651 Presence of right artificial knee joint: Secondary | ICD-10-CM | POA: Diagnosis not present

## 2022-12-29 DIAGNOSIS — D1801 Hemangioma of skin and subcutaneous tissue: Secondary | ICD-10-CM | POA: Diagnosis not present

## 2022-12-29 DIAGNOSIS — L814 Other melanin hyperpigmentation: Secondary | ICD-10-CM | POA: Diagnosis not present

## 2022-12-29 DIAGNOSIS — L821 Other seborrheic keratosis: Secondary | ICD-10-CM | POA: Diagnosis not present

## 2023-01-14 ENCOUNTER — Other Ambulatory Visit (INDEPENDENT_AMBULATORY_CARE_PROVIDER_SITE_OTHER): Payer: Medicare Other

## 2023-01-14 DIAGNOSIS — N289 Disorder of kidney and ureter, unspecified: Secondary | ICD-10-CM

## 2023-01-14 LAB — HEPATIC FUNCTION PANEL
ALT: 24 U/L (ref 0–53)
AST: 21 U/L (ref 0–37)
Albumin: 4.2 g/dL (ref 3.5–5.2)
Alkaline Phosphatase: 69 U/L (ref 39–117)
Bilirubin, Direct: 0.3 mg/dL (ref 0.0–0.3)
Total Bilirubin: 2.1 mg/dL — ABNORMAL HIGH (ref 0.2–1.2)
Total Protein: 6.6 g/dL (ref 6.0–8.3)

## 2023-01-14 LAB — BASIC METABOLIC PANEL
BUN: 24 mg/dL — ABNORMAL HIGH (ref 6–23)
CO2: 25 mEq/L (ref 19–32)
Calcium: 9.7 mg/dL (ref 8.4–10.5)
Chloride: 101 mEq/L (ref 96–112)
Creatinine, Ser: 1.14 mg/dL (ref 0.40–1.50)
GFR: 65.12 mL/min (ref >60.00)
Glucose, Bld: 192 mg/dL — ABNORMAL HIGH (ref 70–99)
Potassium: 4.3 mEq/L (ref 3.5–5.1)
Sodium: 135 mEq/L (ref 135–145)

## 2023-01-15 ENCOUNTER — Other Ambulatory Visit: Payer: Self-pay

## 2023-01-15 DIAGNOSIS — R161 Splenomegaly, not elsewhere classified: Secondary | ICD-10-CM

## 2023-01-15 DIAGNOSIS — K760 Fatty (change of) liver, not elsewhere classified: Secondary | ICD-10-CM

## 2023-01-20 ENCOUNTER — Other Ambulatory Visit: Payer: Medicare Other

## 2023-01-21 ENCOUNTER — Ambulatory Visit
Admission: RE | Admit: 2023-01-21 | Discharge: 2023-01-21 | Disposition: A | Discharge status: Home / Self Care | Payer: Medicare Other | Source: Ambulatory Visit | Attending: Internal Medicine | Admitting: Internal Medicine

## 2023-01-21 DIAGNOSIS — R161 Splenomegaly, not elsewhere classified: Secondary | ICD-10-CM

## 2023-01-21 DIAGNOSIS — K760 Fatty (change of) liver, not elsewhere classified: Secondary | ICD-10-CM | POA: Insufficient documentation

## 2023-02-12 ENCOUNTER — Telehealth: Payer: Self-pay | Admitting: Internal Medicine

## 2023-02-12 ENCOUNTER — Other Ambulatory Visit: Payer: Medicare Other

## 2023-02-12 DIAGNOSIS — E1165 Type 2 diabetes mellitus with hyperglycemia: Secondary | ICD-10-CM

## 2023-02-12 DIAGNOSIS — R7989 Other specified abnormal findings of blood chemistry: Secondary | ICD-10-CM

## 2023-02-12 DIAGNOSIS — Z125 Encounter for screening for malignant neoplasm of prostate: Secondary | ICD-10-CM

## 2023-02-12 DIAGNOSIS — I10 Essential (primary) hypertension: Secondary | ICD-10-CM

## 2023-02-12 NOTE — Telephone Encounter (Signed)
Patient has a lab appt 02/19/2023, there are No orders in.

## 2023-02-12 NOTE — Addendum Note (Signed)
Addended by: Roetta Sessions D on: 02/12/2023 03:45 PM   Modules accepted: Orders

## 2023-02-12 NOTE — Telephone Encounter (Signed)
Labs ordered.

## 2023-02-13 NOTE — Telephone Encounter (Signed)
Orders placed for fasting labs.  Have him come in for fasting labs 02/19/23 visit.  Please cancel the lab appt in May since having drawn in April.

## 2023-02-13 NOTE — Addendum Note (Signed)
Addended by: Alisa Graff on: 02/13/2023 01:50 AM   Modules accepted: Orders

## 2023-02-16 NOTE — Telephone Encounter (Signed)
Spoke with patient. He is coming in to have a hepatic panel drawn 4/4 and then fasting lab in May prior to his appt.

## 2023-02-16 NOTE — Addendum Note (Signed)
Addended by: Roetta Sessions D on: 02/16/2023 08:20 AM   Modules accepted: Orders

## 2023-02-19 ENCOUNTER — Other Ambulatory Visit (INDEPENDENT_AMBULATORY_CARE_PROVIDER_SITE_OTHER): Payer: Medicare Other

## 2023-02-19 LAB — HEPATIC FUNCTION PANEL
ALT: 19 U/L (ref 0–53)
AST: 20 U/L (ref 0–37)
Albumin: 4.4 g/dL (ref 3.5–5.2)
Alkaline Phosphatase: 77 U/L (ref 39–117)
Bilirubin, Direct: 0.2 mg/dL (ref 0.0–0.3)
Total Bilirubin: 1 mg/dL (ref 0.2–1.2)
Total Protein: 6.6 g/dL (ref 6.0–8.3)

## 2023-03-30 ENCOUNTER — Other Ambulatory Visit (INDEPENDENT_AMBULATORY_CARE_PROVIDER_SITE_OTHER): Payer: Medicare Other

## 2023-03-30 DIAGNOSIS — Z125 Encounter for screening for malignant neoplasm of prostate: Secondary | ICD-10-CM

## 2023-03-30 DIAGNOSIS — E1165 Type 2 diabetes mellitus with hyperglycemia: Secondary | ICD-10-CM

## 2023-03-30 LAB — CBC WITH DIFFERENTIAL/PLATELET
Basophils Absolute: 0 10*3/uL (ref 0.0–0.1)
Basophils Relative: 0.4 % (ref 0.0–3.0)
Eosinophils Absolute: 0.3 10*3/uL (ref 0.0–0.7)
Eosinophils Relative: 4.9 % (ref 0.0–5.0)
HCT: 45.1 % (ref 39.0–52.0)
Hemoglobin: 15.5 g/dL (ref 13.0–17.0)
Lymphocytes Relative: 52.5 % — ABNORMAL HIGH (ref 12.0–46.0)
Lymphs Abs: 3.3 10*3/uL (ref 0.7–4.0)
MCHC: 34.3 g/dL (ref 30.0–36.0)
MCV: 96.6 fl (ref 78.0–100.0)
Monocytes Absolute: 0.6 10*3/uL (ref 0.1–1.0)
Monocytes Relative: 9.9 % (ref 3.0–12.0)
Neutro Abs: 2 10*3/uL (ref 1.4–7.7)
Neutrophils Relative %: 32.3 % — ABNORMAL LOW (ref 43.0–77.0)
Platelets: 211 10*3/uL (ref 150.0–400.0)
RBC: 4.67 Mil/uL (ref 4.22–5.81)
RDW: 13.2 % (ref 11.5–15.5)
WBC: 6.2 10*3/uL (ref 4.0–10.5)

## 2023-03-30 LAB — HEPATIC FUNCTION PANEL
ALT: 23 U/L (ref 0–53)
AST: 21 U/L (ref 0–37)
Albumin: 4.1 g/dL (ref 3.5–5.2)
Alkaline Phosphatase: 80 U/L (ref 39–117)
Bilirubin, Direct: 0.2 mg/dL (ref 0.0–0.3)
Total Bilirubin: 1.1 mg/dL (ref 0.2–1.2)
Total Protein: 6.7 g/dL (ref 6.0–8.3)

## 2023-03-30 LAB — TSH: TSH: 1.26 u[IU]/mL (ref 0.35–5.50)

## 2023-03-30 LAB — BASIC METABOLIC PANEL
BUN: 19 mg/dL (ref 6–23)
CO2: 24 mEq/L (ref 19–32)
Calcium: 8.8 mg/dL (ref 8.4–10.5)
Chloride: 104 mEq/L (ref 96–112)
Creatinine, Ser: 1.13 mg/dL (ref 0.40–1.50)
GFR: 65.72 mL/min (ref >60.00)
Glucose, Bld: 146 mg/dL — ABNORMAL HIGH (ref 70–99)
Potassium: 3.7 mEq/L (ref 3.5–5.1)
Sodium: 138 mEq/L (ref 135–145)

## 2023-03-30 LAB — LIPID PANEL
Cholesterol: 131 mg/dL (ref 0–200)
HDL: 24.5 mg/dL — ABNORMAL LOW (ref >39.00)
LDL Cholesterol: 69 mg/dL (ref 0–99)
NonHDL: 106.63
Total CHOL/HDL Ratio: 5
Triglycerides: 186 mg/dL — ABNORMAL HIGH (ref 0.0–149.0)
VLDL: 37.2 mg/dL (ref 0.0–40.0)

## 2023-03-30 LAB — HEMOGLOBIN A1C: Hgb A1c MFr Bld: 6.5 % (ref 4.6–6.5)

## 2023-03-30 LAB — PSA, MEDICARE: PSA: 2.98 ng/ml (ref 0.10–4.00)

## 2023-03-31 ENCOUNTER — Other Ambulatory Visit: Payer: Medicare Other

## 2023-04-01 ENCOUNTER — Encounter: Payer: Self-pay | Admitting: Internal Medicine

## 2023-04-01 ENCOUNTER — Ambulatory Visit (INDEPENDENT_AMBULATORY_CARE_PROVIDER_SITE_OTHER): Payer: Medicare Other | Admitting: Internal Medicine

## 2023-04-01 VITALS — BP 122/70 | HR 60 | Temp 97.9°F | Resp 16 | Ht 67.0 in | Wt 224.0 lb

## 2023-04-01 DIAGNOSIS — E7800 Pure hypercholesterolemia, unspecified: Secondary | ICD-10-CM | POA: Diagnosis not present

## 2023-04-01 DIAGNOSIS — R7989 Other specified abnormal findings of blood chemistry: Secondary | ICD-10-CM

## 2023-04-01 DIAGNOSIS — E1165 Type 2 diabetes mellitus with hyperglycemia: Secondary | ICD-10-CM

## 2023-04-01 DIAGNOSIS — I10 Essential (primary) hypertension: Secondary | ICD-10-CM | POA: Diagnosis not present

## 2023-04-01 DIAGNOSIS — M25561 Pain in right knee: Secondary | ICD-10-CM | POA: Diagnosis not present

## 2023-04-01 MED ORDER — LISINOPRIL-HYDROCHLOROTHIAZIDE 10-12.5 MG PO TABS: 1.0000 | ORAL_TABLET | Freq: Every day | ORAL | 1 refills | Status: DC

## 2023-04-01 MED ORDER — BISOPROLOL-HYDROCHLOROTHIAZIDE 10-6.25 MG PO TABS: 1.0000 | ORAL_TABLET | Freq: Every day | ORAL | 1 refills | Status: DC

## 2023-04-01 NOTE — Progress Notes (Signed)
Subjective:    Patient ID: Jamie Martin, male    DOB: [birth year], 72 y.o.   MRN: [id number]  Patient here for  Chief Complaint  Patient presents with   Medical Management of Chronic Issues    HPI Here to follow up regarding diabetes, hypercholesterolemia and hypertension.  He reports he is doing well.  No chest pain or sob reported.  Had nasal congestion last week.  Tool otc mucinex.  Feels better now.  No chest congestion or sob.  No chest pain.  No abdominal pain or bowel change.  Discussed labs.  Discussed diet and exercise.  Discussed colonoscopy.  He declines.     Past Medical History:  Diagnosis Date   Chicken pox    History of hyperglycemia    Hypertension    Plantar fasciitis    History of   Past Surgical History:  Procedure Laterality Date   arthroscopic knee     x2- Performed by Dr. by Erin Sons   FOOT SURGERY  2009   KNEE ARTHROSCOPY  [id number]   right knee   PLANTAR FASCIECTOMY  2012   TONSILLECTOMY     TONSILLECTOMY AND ADENOIDECTOMY     Family History  Problem Relation Age of Onset   Hypertension Mother    Arthritis Mother    Stroke Father    Colon cancer Neg Hx    Prostate cancer Neg Hx    Social History   Socioeconomic History   Marital status: Married    Spouse name: Not on file   Number of children: Not on file   Years of education: Not on file   Highest education level: Bachelor's degree (e.g., BA, AB, BS)  Occupational History   Not on file  Tobacco Use   Smoking status: Former    Types: Cigarettes    Quit date: 11/17/2005    Years since quitting: 17.4   Smokeless tobacco: Never  Vaping Use   Vaping Use: Never used  Substance and Sexual Activity   Alcohol use: Yes    Alcohol/week: 0.0 standard drinks of alcohol   Drug use: No   Sexual activity: Not on file  Other Topics Concern   Not on file  Social History Narrative   Married and has 2 children.   Social Determinants of Health   Financial Resource Strain: Low Risk   (03/28/2023)   Overall Financial Resource Strain (CARDIA)    Difficulty of Paying Living Expenses: Not hard at all  Food Insecurity: No Food Insecurity (03/28/2023)   Hunger Vital Sign    Worried About Running Out of Food in the Last Year: Never true    Ran Out of Food in the Last Year: Never true  Transportation Needs: No Transportation Needs (03/28/2023)   PRAPARE - Administrator, Civil Service (Medical): No    Lack of Transportation (Non-Medical): No  Physical Activity: Insufficiently Active (03/28/2023)   Exercise Vital Sign    Days of Exercise per Week: 3 days    Minutes of Exercise per Session: 40 min  Stress: No Stress Concern Present (03/28/2023)   [employer] of Occupational Health - Occupational Stress Questionnaire    Feeling of Stress : Not at all  Social Connections: Unknown (03/28/2023)   Social Connection and Isolation Panel [NHANES]    Frequency of Communication with Friends and Family: Three times a week    Frequency of Social Gatherings with Friends and Family: Three times a week    Attends Religious Services:  Patient declined    Active Member of Clubs or Organizations: Yes    Attends Banker Meetings: More than 4 times per year    Marital Status: Married     Review of Systems  Constitutional:  Negative for appetite change and unexpected weight change.  HENT:  Positive for congestion and postnasal drip.   Respiratory:  Negative for cough, chest tightness and shortness of breath.   Cardiovascular:  Negative for chest pain, palpitations and leg swelling.  Gastrointestinal:  Negative for abdominal pain, diarrhea, nausea and vomiting.  Genitourinary:  Negative for difficulty urinating and dysuria.  Musculoskeletal:  Negative for joint swelling and myalgias.  Skin:  Negative for color change and rash.  Neurological:  Negative for dizziness and headaches.  Psychiatric/Behavioral:  Negative for agitation and dysphoric mood.         Objective:     BP 122/70   Pulse 60   Temp 97.9 F (36.6 C)   Resp 16   Ht 5\' 7"  (1.702 m)   Wt 224 lb (101.6 kg)   SpO2 98%   BMI 35.08 kg/m  Wt Readings from Last 3 Encounters:  04/01/23 224 lb (101.6 kg)  12/02/22 228 lb (103.4 kg)  07/31/22 223 lb 3.2 oz (101.2 kg)    Physical Exam Vitals reviewed.  Constitutional:      General: He is not in acute distress.    Appearance: Normal appearance. He is well-developed.  HENT:     Head: Normocephalic and atraumatic.     Right Ear: External ear normal.     Left Ear: External ear normal.  Eyes:     General: No scleral icterus.       Right eye: No discharge.        Left eye: No discharge.     Conjunctiva/sclera: Conjunctivae normal.  Cardiovascular:     Rate and Rhythm: Normal rate and regular rhythm.  Pulmonary:     Effort: Pulmonary effort is normal. No respiratory distress.     Breath sounds: Normal breath sounds.  Abdominal:     General: Bowel sounds are normal.     Palpations: Abdomen is soft.     Tenderness: There is no abdominal tenderness.  Musculoskeletal:        General: No swelling or tenderness.     Cervical back: Neck supple. No tenderness.  Lymphadenopathy:     Cervical: No cervical adenopathy.  Skin:    Findings: No erythema or rash.  Neurological:     Mental Status: He is alert.  Psychiatric:        Mood and Affect: Mood normal.        Behavior: Behavior normal.      Outpatient Encounter Medications as of 04/01/2023  Medication Sig   Aspirin 81 MG EC tablet Take 81 mg by mouth daily.   bisoprolol-hydrochlorothiazide (ZIAC) 10-6.25 MG tablet Take 1 tablet by mouth daily.   lisinopril-hydrochlorothiazide (ZESTORETIC) 10-12.5 MG tablet Take 1 tablet by mouth daily.   [DISCONTINUED] bisoprolol-hydrochlorothiazide (ZIAC) 10-6.25 MG tablet Take 1 tablet by mouth daily.   [DISCONTINUED] lisinopril-hydrochlorothiazide (ZESTORETIC) 10-12.5 MG tablet Take 1 tablet by mouth daily.   [DISCONTINUED]  meloxicam (MOBIC) 15 MG tablet Take 15 mg by mouth daily as needed.   [DISCONTINUED] ondansetron (ZOFRAN ODT) 4 MG disintegrating tablet Take 1 tablet (4 mg total) by mouth every 8 (eight) hours as needed for nausea or vomiting.   No facility-administered encounter medications on file as of 04/01/2023.  Lab Results  Component Value Date   WBC 6.2 03/30/2023   HGB 15.5 03/30/2023   HCT 45.1 03/30/2023   PLT 211.0 03/30/2023   GLUCOSE 146 (H) 03/30/2023   CHOL 131 03/30/2023   TRIG 186.0 (H) 03/30/2023   HDL 24.50 (L) 03/30/2023   LDLDIRECT 103.9 05/03/2013   LDLCALC 69 03/30/2023   ALT 23 03/30/2023   AST 21 03/30/2023   NA 138 03/30/2023   K 3.7 03/30/2023   CL 104 03/30/2023   CREATININE 1.13 03/30/2023   BUN 19 03/30/2023   CO2 24 03/30/2023   TSH 1.26 03/30/2023   PSA 2.98 03/30/2023   INR 1.0 04/25/2011   HGBA1C 6.5 03/30/2023   MICROALBUR 1.2 02/26/2022    US Abdomen Complete  Result Date: 01/21/2023 CLINICAL DATA:  Fatty liver, elevated bilirubin. EXAM: ABDOMEN ULTRASOUND COMPLETE COMPARISON:  January 14, 2008 FINDINGS: Gallbladder: No gallstones or wall thickening visualized. No sonographic Murphy sign noted by sonographer. Common bile duct: Diameter: 2.9 mm Liver: No focal lesion identified. Diffuse increased echotexture of the liver. Portal vein is patent on color Doppler imaging with normal direction of blood flow towards the liver. IVC: No abnormality visualized. Pancreas: Visualized portion unremarkable. Spleen: Size and appearance within normal limits. Right Kidney: Length: 10.9 cm. Echogenicity within normal limits. No mass or hydronephrosis visualized. Left Kidney: Length: 11 cm. Echogenicity within normal limits. No mass or hydronephrosis visualized. Abdominal aorta: No aneurysm visualized. Other findings: None. IMPRESSION: Diffuse increased echotexture of the liver. This is nonspecific but can be seen in fatty infiltration of liver. Electronically Signed   By:  Sherian Rein M.D.   On: 01/21/2023 10:26       Assessment & Plan:  Type 2 diabetes mellitus with hyperglycemia, without long-term current use of insulin (HCC) Assessment & Plan: Low carb diet and exercise given elevated blood sugars.  Follow met b and a1c.  Lab Results  Component Value Date   HGBA1C 6.5 03/30/2023    Orders: -     Hemoglobin A1c; Future -     Microalbumin / creatinine urine ratio; Future  Hypercholesterolemia -     Lipid panel; Future -     Hepatic function panel; Future -     Basic metabolic panel; Future  Abnormal liver function tests Assessment & Plan: Diet, exercise and weight loss.  Follow liver function tests.  Bilirubin elevated recent check.  Last check wnl.    Hyperbilirubinemia Assessment & Plan: Recent check wnl.    Primary hypertension Assessment & Plan: Continue lisinopril/hctz and ziac.  Follow met b.    Right knee pain, unspecified chronicity Assessment & Plan: S/p TKR.  Did well with surgery.  Ortho.    Other orders -     Bisoprolol-hydroCHLOROthiazide; Take 1 tablet by mouth daily.  Dispense: 90 tablet; Refill: 1 -     Lisinopril-hydroCHLOROthiazide; Take 1 tablet by mouth daily.  Dispense: 90 tablet; Refill: 1     Dale Bism[place], MD

## 2023-04-03 ENCOUNTER — Ambulatory Visit: Payer: Medicare Other | Admitting: Internal Medicine

## 2023-04-13 ENCOUNTER — Encounter: Payer: Self-pay | Admitting: Internal Medicine

## 2023-04-13 NOTE — Assessment & Plan Note (Addendum)
S/p TKR.  Did well with surgery.  Ortho.

## 2023-04-13 NOTE — Assessment & Plan Note (Signed)
Low carb diet and exercise given elevated blood sugars.  Follow met b and a1c.  Lab Results  Component Value Date   HGBA1C 6.5 03/30/2023

## 2023-04-13 NOTE — Assessment & Plan Note (Signed)
Diet, exercise and weight loss.  Follow liver function tests.  Bilirubin elevated recent check.  Last check wnl.

## 2023-04-13 NOTE — Assessment & Plan Note (Signed)
Continue lisinopril/hctz and ziac.  Follow met b.  

## 2023-04-13 NOTE — Assessment & Plan Note (Signed)
Recent check wnl.   

## 2023-06-24 ENCOUNTER — Encounter: Payer: Self-pay | Admitting: Internal Medicine

## 2023-06-24 MED ORDER — LISINOPRIL-HYDROCHLOROTHIAZIDE 10-12.5 MG PO TABS: 1.0000 | ORAL_TABLET | Freq: Every day | ORAL | 0 refills | Status: DC

## 2023-06-24 MED ORDER — BISOPROLOL-HYDROCHLOROTHIAZIDE 10-6.25 MG PO TABS: 1.0000 | ORAL_TABLET | Freq: Every day | ORAL | 0 refills | Status: DC

## 2023-06-24 NOTE — Telephone Encounter (Signed)
Ok for 30 day supplies of his BP medications

## 2023-06-24 NOTE — Telephone Encounter (Signed)
Ok to send in 30-day supply.

## 2023-07-01 ENCOUNTER — Other Ambulatory Visit: Payer: Self-pay | Admitting: Internal Medicine

## 2023-07-01 NOTE — Telephone Encounter (Signed)
Please refuse this. He gets his 90 day supplies from mail order. Had to send in short supply to local pharmacy for him.

## 2023-07-02 ENCOUNTER — Encounter (INDEPENDENT_AMBULATORY_CARE_PROVIDER_SITE_OTHER): Payer: Self-pay

## 2023-07-27 DIAGNOSIS — H11153 Pinguecula, bilateral: Secondary | ICD-10-CM | POA: Diagnosis not present

## 2023-07-27 DIAGNOSIS — H1131 Conjunctival hemorrhage, right eye: Secondary | ICD-10-CM | POA: Diagnosis not present

## 2023-07-28 ENCOUNTER — Other Ambulatory Visit (INDEPENDENT_AMBULATORY_CARE_PROVIDER_SITE_OTHER): Payer: Medicare Other

## 2023-07-28 DIAGNOSIS — E7800 Pure hypercholesterolemia, unspecified: Secondary | ICD-10-CM

## 2023-07-28 DIAGNOSIS — E1165 Type 2 diabetes mellitus with hyperglycemia: Secondary | ICD-10-CM

## 2023-07-28 LAB — HEPATIC FUNCTION PANEL
ALT: 19 U/L (ref 0–53)
AST: 18 U/L (ref 0–37)
Albumin: 3.9 g/dL (ref 3.5–5.2)
Alkaline Phosphatase: 70 U/L (ref 39–117)
Bilirubin, Direct: 0.1 mg/dL (ref 0.0–0.3)
Total Bilirubin: 0.7 mg/dL (ref 0.2–1.2)
Total Protein: 6.4 g/dL (ref 6.0–8.3)

## 2023-07-28 LAB — BASIC METABOLIC PANEL
BUN: 16 mg/dL (ref 6–23)
CO2: 27 meq/L (ref 19–32)
Calcium: 9.1 mg/dL (ref 8.4–10.5)
Chloride: 105 meq/L (ref 96–112)
Creatinine, Ser: 1.17 mg/dL (ref 0.40–1.50)
GFR: 62.89 mL/min (ref >60.00)
Glucose, Bld: 122 mg/dL — ABNORMAL HIGH (ref 70–99)
Potassium: 4.5 meq/L (ref 3.5–5.1)
Sodium: 138 meq/L (ref 135–145)

## 2023-07-28 LAB — MICROALBUMIN / CREATININE URINE RATIO
Creatinine,U: 103.2 mg/dL
Microalb Creat Ratio: 0.7 mg/g (ref 0.0–30.0)
Microalb, Ur: <0.7 mg/dL (ref 0.0–1.9)

## 2023-07-28 LAB — HEMOGLOBIN A1C: Hgb A1c MFr Bld: 6.6 % — ABNORMAL HIGH (ref 4.6–6.5)

## 2023-07-28 LAB — LIPID PANEL
Cholesterol: 132 mg/dL (ref 0–200)
HDL: 33.6 mg/dL — ABNORMAL LOW (ref >39.00)
LDL Cholesterol: 72 mg/dL (ref 0–99)
NonHDL: 98.45
Total CHOL/HDL Ratio: 4
Triglycerides: 132 mg/dL (ref 0.0–149.0)
VLDL: 26.4 mg/dL (ref 0.0–40.0)

## 2023-07-30 ENCOUNTER — Ambulatory Visit (INDEPENDENT_AMBULATORY_CARE_PROVIDER_SITE_OTHER): Payer: Medicare Other | Admitting: Internal Medicine

## 2023-07-30 ENCOUNTER — Encounter: Payer: Self-pay | Admitting: Internal Medicine

## 2023-07-30 VITALS — BP 118/72 | HR 64 | Temp 98.1°F | Ht 68.0 in | Wt 218.0 lb

## 2023-07-30 DIAGNOSIS — R7989 Other specified abnormal findings of blood chemistry: Secondary | ICD-10-CM

## 2023-07-30 DIAGNOSIS — E7800 Pure hypercholesterolemia, unspecified: Secondary | ICD-10-CM | POA: Diagnosis not present

## 2023-07-30 DIAGNOSIS — E1165 Type 2 diabetes mellitus with hyperglycemia: Secondary | ICD-10-CM

## 2023-07-30 DIAGNOSIS — I10 Essential (primary) hypertension: Secondary | ICD-10-CM | POA: Diagnosis not present

## 2023-07-30 LAB — HM DIABETES FOOT EXAM

## 2023-07-30 NOTE — Progress Notes (Signed)
Subjective:    Patient ID: Jamie Martin, male    DOB: Jun 06, 1951, 72 y.o.   MRN: [id number]  Patient here for  Chief Complaint  Patient presents with   Medical Management of Chronic Issues    HPI Here to follow up regarding diabetes, hypercholesterolemia and hypertension.  He reports he is doing well.  No chest pain or sob reported. Reports had covid one month ago.  Symptoms resolved.  No residual problems.  No acid relfux reported.  No abdominal pain or bowel change reported.  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  No eye pain or vision change. Discussed labs. LDL 72.  Discussed medication.    Past Medical History:  Diagnosis Date   Chicken pox    History of hyperglycemia    Hypertension    Plantar fasciitis    History of   Past Surgical History:  Procedure Laterality Date   arthroscopic knee     x2- Performed by Dr. by Erin Sons   FOOT SURGERY  2009   KNEE ARTHROSCOPY  [id number],[id number]   right knee   PLANTAR FASCIECTOMY  2012   TONSILLECTOMY     TONSILLECTOMY AND ADENOIDECTOMY     Family History  Problem Relation Age of Onset   Hypertension Mother    Arthritis Mother    Stroke Father    Colon cancer Neg Hx    Prostate cancer Neg Hx    Social History   Socioeconomic History   Marital status: Married    Spouse name: Not on file   Number of children: Not on file   Years of education: Not on file   Highest education level: Bachelor's degree (e.g., BA, AB, BS)  Occupational History   Not on file  Tobacco Use   Smoking status: Former    Current packs/day: 0.00    Types: Cigarettes    Quit date: 11/17/2005    Years since quitting: 17.7   Smokeless tobacco: Never  Vaping Use   Vaping status: Never Used  Substance and Sexual Activity   Alcohol use: Yes    Alcohol/week: 0.0 standard drinks of alcohol   Drug use: No   Sexual activity: Not on file  Other Topics Concern   Not on file  Social History Narrative    Married and has 2 children.   Social Determinants of Health   Financial Resource Strain: Low Risk  (03/28/2023)   Overall Financial Resource Strain (CARDIA)    Difficulty of Paying Living Expenses: Not hard at all  Food Insecurity: No Food Insecurity (03/28/2023)   Hunger Vital Sign    Worried About Running Out of Food in the Last Year: Never true    Ran Out of Food in the Last Year: Never true  Transportation Needs: No Transportation Needs (03/28/2023)   PRAPARE - Administrator, Civil Service (Medical): No    Lack of Transportation (Non-Medical): No  Physical Activity: Insufficiently Active (03/28/2023)   Exercise Vital Sign    Days of Exercise per Week: 3 days    Minutes of Exercise per Session: 40 min  Stress: No Stress Concern Present (03/28/2023)   [employer] of Occupational Health - Occupational Stress Questionnaire    Feeling of Stress : Not at all  Social Connections: Unknown (03/28/2023)   Social Connection and Isolation Panel [NHANES]    Frequency of Communication with Friends and Family: Three times a week    Frequency of Social Gatherings  with Friends and Family: Three times a week    Attends Religious Services: Patient declined    Active Member of Clubs or Organizations: Yes    Attends Banker Meetings: More than 4 times per year    Marital Status: Married     Review of Systems  Constitutional:  Negative for appetite change and unexpected weight change.  HENT:  Negative for congestion and sinus pressure.   Respiratory:  Negative for cough, chest tightness and shortness of breath.   Cardiovascular:  Negative for chest pain, palpitations and leg swelling.  Gastrointestinal:  Negative for abdominal pain, diarrhea, nausea and vomiting.  Genitourinary:  Negative for difficulty urinating and dysuria.  Musculoskeletal:  Negative for joint swelling and myalgias.  Skin:  Negative for color change and rash.  Neurological:  Negative for dizziness  and headaches.  Psychiatric/Behavioral:  Negative for agitation and dysphoric mood.        Objective:     BP 118/72   Pulse 64   Temp 98.1 F (36.7 C) (Oral)   Ht 5\' 8"  (1.727 m)   Wt 218 lb (98.9 kg)   SpO2 97%   BMI 33.15 kg/m  Wt Readings from Last 3 Encounters:  07/30/23 218 lb (98.9 kg)  04/01/23 224 lb (101.6 kg)  12/02/22 228 lb (103.4 kg)    Physical Exam Vitals reviewed.  Constitutional:      General: He is not in acute distress.    Appearance: Normal appearance. He is well-developed.  HENT:     Head: Normocephalic and atraumatic.     Right Ear: External ear normal.     Left Ear: External ear normal.  Eyes:     General: No scleral icterus.       Right eye: No discharge.        Left eye: No discharge.     Conjunctiva/sclera: Conjunctivae normal.  Cardiovascular:     Rate and Rhythm: Normal rate and regular rhythm.  Pulmonary:     Effort: Pulmonary effort is normal. No respiratory distress.     Breath sounds: Normal breath sounds.  Abdominal:     General: Bowel sounds are normal.     Palpations: Abdomen is soft.     Tenderness: There is no abdominal tenderness.  Musculoskeletal:        General: No swelling or tenderness.     Cervical back: Neck supple. No tenderness.  Lymphadenopathy:     Cervical: No cervical adenopathy.  Skin:    Findings: No erythema or rash.  Neurological:     Mental Status: He is alert.  Psychiatric:        Mood and Affect: Mood normal.        Behavior: Behavior normal.      Outpatient Encounter Medications as of 07/30/2023  Medication Sig   Aspirin 81 MG EC tablet Take 81 mg by mouth daily.   bisoprolol-hydrochlorothiazide (ZIAC) 10-6.25 MG tablet Take 1 tablet by mouth daily.   lisinopril-hydrochlorothiazide (ZESTORETIC) 10-12.5 MG tablet Take 1 tablet by mouth daily.   No facility-administered encounter medications on file as of 07/30/2023.     Lab Results  Component Value Date   WBC 6.2 03/30/2023   HGB 15.5  03/30/2023   HCT 45.1 03/30/2023   PLT 211.0 03/30/2023   GLUCOSE 122 (H) 07/28/2023   CHOL 132 07/28/2023   TRIG 132.0 07/28/2023   HDL 33.60 (L) 07/28/2023   LDLDIRECT 103.9 05/03/2013   LDLCALC 72 07/28/2023   ALT 19  07/28/2023   AST 18 07/28/2023   NA 138 07/28/2023   K 4.5 07/28/2023   CL 105 07/28/2023   CREATININE 1.17 07/28/2023   BUN 16 07/28/2023   CO2 27 07/28/2023   TSH 1.26 03/30/2023   PSA 2.98 03/30/2023   INR 1.0 04/25/2011   HGBA1C 6.6 (H) 07/28/2023   MICROALBUR <0.7 07/28/2023    US Abdomen Complete  Result Date: 01/21/2023 CLINICAL DATA:  Fatty liver, elevated bilirubin. EXAM: ABDOMEN ULTRASOUND COMPLETE COMPARISON:  January 14, 2008 FINDINGS: Gallbladder: No gallstones or wall thickening visualized. No sonographic Murphy sign noted by sonographer. Common bile duct: Diameter: 2.9 mm Liver: No focal lesion identified. Diffuse increased echotexture of the liver. Portal vein is patent on color Doppler imaging with normal direction of blood flow towards the liver. IVC: No abnormality visualized. Pancreas: Visualized portion unremarkable. Spleen: Size and appearance within normal limits. Right Kidney: Length: 10.9 cm. Echogenicity within normal limits. No mass or hydronephrosis visualized. Left Kidney: Length: 11 cm. Echogenicity within normal limits. No mass or hydronephrosis visualized. Abdominal aorta: No aneurysm visualized. Other findings: None. IMPRESSION: Diffuse increased echotexture of the liver. This is nonspecific but can be seen in fatty infiltration of liver. Electronically Signed   By: Sherian Rein M.D.   On: 01/21/2023 10:26       Assessment & Plan:  Type 2 diabetes mellitus with hyperglycemia, without long-term current use of insulin (HCC) Assessment & Plan: Low carb diet and exercise given elevated blood sugars.  Follow met b and a1c.  Lab Results  Component Value Date   HGBA1C 6.6 (H) 07/28/2023    Orders: -     Hemoglobin A1c;  Future  Hypercholesterolemia -     Basic metabolic panel; Future -     Hepatic function panel; Future -     Lipid panel; Future  Primary hypertension Assessment & Plan: Discussed lisinopril/hydrochlorothiazide and bisoprolol/hydrochlorothiazide regimen.  He has been on this regimen for years.  Blood pressure has been well controlled.  Discussed modifying medication. Discussed increased lisinopril and possibly changing bisoprolol to carvedilol.  Given blood pressures controlled, will continue medication as he is doing.  Follow pressures.  Follow metabolic panel.    Abnormal liver function tests Assessment & Plan: Diet, exercise and weight loss.  Follow liver function tests.  Last check wnl.       Dale Walton, MD

## 2023-08-01 ENCOUNTER — Encounter: Payer: Self-pay | Admitting: Internal Medicine

## 2023-08-01 NOTE — Assessment & Plan Note (Signed)
Low carb diet and exercise given elevated blood sugars.  Follow met b and a1c.  Lab Results  Component Value Date   HGBA1C 6.6 (H) 07/28/2023

## 2023-08-01 NOTE — Assessment & Plan Note (Signed)
Diet, exercise and weight loss.  Follow liver function tests.  Last check wnl.

## 2023-08-01 NOTE — Assessment & Plan Note (Signed)
Discussed lisinopril/hydrochlorothiazide and bisoprolol/hydrochlorothiazide regimen.  He has been on this regimen for years.  Blood pressure has been well controlled.  Discussed modifying medication. Discussed increased lisinopril and possibly changing bisoprolol to carvedilol.  Given blood pressures controlled, will continue medication as he is doing.  Follow pressures.  Follow metabolic panel.

## 2023-09-30 DIAGNOSIS — M79671 Pain in right foot: Secondary | ICD-10-CM | POA: Diagnosis not present

## 2023-12-07 ENCOUNTER — Other Ambulatory Visit (INDEPENDENT_AMBULATORY_CARE_PROVIDER_SITE_OTHER): Payer: Medicare Other

## 2023-12-07 DIAGNOSIS — E1165 Type 2 diabetes mellitus with hyperglycemia: Secondary | ICD-10-CM

## 2023-12-07 DIAGNOSIS — E7800 Pure hypercholesterolemia, unspecified: Secondary | ICD-10-CM | POA: Diagnosis not present

## 2023-12-07 DIAGNOSIS — H2513 Age-related nuclear cataract, bilateral: Secondary | ICD-10-CM | POA: Diagnosis not present

## 2023-12-07 DIAGNOSIS — H5203 Hypermetropia, bilateral: Secondary | ICD-10-CM | POA: Diagnosis not present

## 2023-12-07 LAB — HEPATIC FUNCTION PANEL
ALT: 20 U/L (ref 0–53)
AST: 16 U/L (ref 0–37)
Albumin: 4.2 g/dL (ref 3.5–5.2)
Alkaline Phosphatase: 69 U/L (ref 39–117)
Bilirubin, Direct: 0.2 mg/dL (ref 0.0–0.3)
Total Bilirubin: 0.9 mg/dL (ref 0.2–1.2)
Total Protein: 6.7 g/dL (ref 6.0–8.3)

## 2023-12-07 LAB — LIPID PANEL
Cholesterol: 153 mg/dL (ref 0–200)
HDL: 33.7 mg/dL — ABNORMAL LOW (ref >39.00)
LDL Cholesterol: 92 mg/dL (ref 0–99)
NonHDL: 118.91
Total CHOL/HDL Ratio: 5
Triglycerides: 136 mg/dL (ref 0.0–149.0)
VLDL: 27.2 mg/dL (ref 0.0–40.0)

## 2023-12-07 LAB — BASIC METABOLIC PANEL
BUN: 19 mg/dL (ref 6–23)
CO2: 27 meq/L (ref 19–32)
Calcium: 9.4 mg/dL (ref 8.4–10.5)
Chloride: 104 meq/L (ref 96–112)
Creatinine, Ser: 1.06 mg/dL (ref 0.40–1.50)
GFR: 70.62 mL/min (ref >60.00)
Glucose, Bld: 141 mg/dL — ABNORMAL HIGH (ref 70–99)
Potassium: 4 meq/L (ref 3.5–5.1)
Sodium: 140 meq/L (ref 135–145)

## 2023-12-07 LAB — HM DIABETES EYE EXAM

## 2023-12-07 LAB — HEMOGLOBIN A1C: Hgb A1c MFr Bld: 6.8 % — ABNORMAL HIGH (ref 4.6–6.5)

## 2023-12-09 ENCOUNTER — Ambulatory Visit: Payer: Medicare Other | Admitting: Internal Medicine

## 2023-12-09 ENCOUNTER — Encounter: Payer: Self-pay | Admitting: Internal Medicine

## 2023-12-09 ENCOUNTER — Other Ambulatory Visit: Payer: Self-pay | Admitting: Internal Medicine

## 2023-12-09 VITALS — BP 126/70 | HR 72 | Temp 98.0°F | Resp 16 | Ht 68.0 in | Wt 228.0 lb

## 2023-12-09 DIAGNOSIS — I10 Essential (primary) hypertension: Secondary | ICD-10-CM

## 2023-12-09 DIAGNOSIS — R7989 Other specified abnormal findings of blood chemistry: Secondary | ICD-10-CM

## 2023-12-09 DIAGNOSIS — E1165 Type 2 diabetes mellitus with hyperglycemia: Secondary | ICD-10-CM

## 2023-12-09 DIAGNOSIS — Z125 Encounter for screening for malignant neoplasm of prostate: Secondary | ICD-10-CM | POA: Diagnosis not present

## 2023-12-09 DIAGNOSIS — E7800 Pure hypercholesterolemia, unspecified: Secondary | ICD-10-CM | POA: Diagnosis not present

## 2023-12-09 DIAGNOSIS — Z0000 Encounter for general adult medical examination without abnormal findings: Secondary | ICD-10-CM

## 2023-12-09 MED ORDER — ROSUVASTATIN CALCIUM 5 MG PO TABS: 5.0000 mg | ORAL_TABLET | ORAL | 1 refills | Status: DC

## 2023-12-09 MED ORDER — ROSUVASTATIN CALCIUM 5 MG PO TABS: 5.0000 mg | ORAL_TABLET | ORAL | Status: DC

## 2023-12-09 NOTE — Assessment & Plan Note (Signed)
Have discussed lisinopril/hydrochlorothiazide and bisoprolol/hydrochlorothiazide regimen.  He has been on this regimen for years.  Blood pressure has been well controlled.  Have previous discussed modifying medication. Have discussed increased lisinopril and possibly changing bisoprolol to carvedilol.  Given blood pressures controlled, will continue medication as he is doing.  Follow pressures.  Follow metabolic panel.

## 2023-12-09 NOTE — Progress Notes (Signed)
Subjective:    Patient ID: Jamie Martin, male    DOB: 23-Sep-1951, 73 y.o.   MRN: [id number]  Patient here for  Chief Complaint  Patient presents with   Medical Management of Chronic Issues    HPI Here for a scheduled follow up. Saw Dr Hyacinth Meeker 09/30/23 - foot pain - felt to be possible neuroma vs ligamentous strain (right foot). Recommended mobic and warm saline soaks. He reports he is doing well. Feels good. No chest pain or sob reported. No cough or congestion now. No abdominal pain or bowel change reported. Discussed labs. Discussed low carb diet and exercise.    Past Medical History:  Diagnosis Date   Chicken pox    History of hyperglycemia    Hypertension    Plantar fasciitis    History of   Past Surgical History:  Procedure Laterality Date   arthroscopic knee     x2- Performed by Dr. by Erin Sons   FOOT SURGERY  2009   KNEE ARTHROSCOPY  [id number]   right knee   PLANTAR FASCIECTOMY  2012   TONSILLECTOMY     TONSILLECTOMY AND ADENOIDECTOMY     Family History  Problem Relation Age of Onset   Hypertension Mother    Arthritis Mother    Stroke Father    Colon cancer Neg Hx    Prostate cancer Neg Hx    Social History   Socioeconomic History   Marital status: Married    Spouse name: Not on file   Number of children: Not on file   Years of education: Not on file   Highest education level: Bachelor's degree (e.g., BA, AB, BS)  Occupational History   Not on file  Tobacco Use   Smoking status: Former    Current packs/day: 0.00    Types: Cigarettes    Quit date: 11/17/2005    Years since quitting: 18.0   Smokeless tobacco: Never  Vaping Use   Vaping status: Never Used  Substance and Sexual Activity   Alcohol use: Yes    Alcohol/week: 0.0 standard drinks of alcohol   Drug use: No   Sexual activity: Not on file  Other Topics Concern   Not on file  Social History Narrative   Married and has 2 children.   Social Drivers of Research scientist (physical sciences) Strain: Low Risk  (12/07/2023)   Overall Financial Resource Strain (CARDIA)    Difficulty of Paying Living Expenses: Not hard at all  Food Insecurity: No Food Insecurity (12/07/2023)   Hunger Vital Sign    Worried About Running Out of Food in the Last Year: Never true    Ran Out of Food in the Last Year: Never true  Transportation Needs: No Transportation Needs (12/07/2023)   PRAPARE - Administrator, Civil Service (Medical): No    Lack of Transportation (Non-Medical): No  Physical Activity: Inactive (12/07/2023)   Exercise Vital Sign    Days of Exercise per Week: 0 days    Minutes of Exercise per Session: 40 min  Stress: No Stress Concern Present (03/28/2023)   [employer] of Occupational Health - Occupational Stress Questionnaire    Feeling of Stress : Not at all  Social Connections: Unknown (12/07/2023)   Social Connection and Isolation Panel [NHANES]    Frequency of Communication with Friends and Family: Twice a week    Frequency of Social Gatherings with Friends and Family: Twice a week    Attends Religious Services: Patient declined  Active Member of Clubs or Organizations: Yes    Attends Banker Meetings: More than 4 times per year    Marital Status: Married     Review of Systems  Constitutional:  Negative for appetite change and unexpected weight change.  HENT:  Negative for congestion and sinus pressure.   Respiratory:  Negative for cough, chest tightness and shortness of breath.   Cardiovascular:  Negative for chest pain and palpitations.  Gastrointestinal:  Negative for abdominal pain, constipation, diarrhea and vomiting.  Genitourinary:  Negative for difficulty urinating and dysuria.  Musculoskeletal:  Negative for joint swelling and myalgias.  Skin:  Negative for color change and rash.  Neurological:  Negative for dizziness and headaches.  Psychiatric/Behavioral:  Negative for agitation and dysphoric mood.        Objective:      BP 126/70   Pulse 72   Temp 98 F (36.7 C)   Resp 16   Ht 5\' 8"  (1.727 m)   Wt 228 lb (103.4 kg)   SpO2 99%   BMI 34.67 kg/m  Wt Readings from Last 3 Encounters:  12/09/23 228 lb (103.4 kg)  07/30/23 218 lb (98.9 kg)  04/01/23 224 lb (101.6 kg)    Physical Exam Vitals reviewed.  Constitutional:      General: He is not in acute distress.    Appearance: Normal appearance. He is well-developed.  HENT:     Head: Normocephalic and atraumatic.     Right Ear: External ear normal.     Left Ear: External ear normal.     Mouth/Throat:     Pharynx: No oropharyngeal exudate or posterior oropharyngeal erythema.  Eyes:     General: No scleral icterus.       Right eye: No discharge.        Left eye: No discharge.     Conjunctiva/sclera: Conjunctivae normal.  Cardiovascular:     Rate and Rhythm: Normal rate and regular rhythm.  Pulmonary:     Effort: Pulmonary effort is normal. No respiratory distress.     Breath sounds: Normal breath sounds.  Abdominal:     General: Bowel sounds are normal.     Palpations: Abdomen is soft.     Tenderness: There is no abdominal tenderness.  Musculoskeletal:        General: No swelling or tenderness.     Cervical back: Neck supple. No tenderness.  Lymphadenopathy:     Cervical: No cervical adenopathy.  Skin:    Findings: No erythema or rash.  Neurological:     Mental Status: He is alert.  Psychiatric:        Mood and Affect: Mood normal.        Behavior: Behavior normal.         Outpatient Encounter Medications as of 12/09/2023  Medication Sig   Aspirin 81 MG EC tablet Take 81 mg by mouth daily.   bisoprolol-hydrochlorothiazide (ZIAC) 10-6.25 MG tablet Take 1 tablet by mouth daily.   lisinopril-hydrochlorothiazide (ZESTORETIC) 10-12.5 MG tablet Take 1 tablet by mouth daily.   [START ON 12/10/2023] rosuvastatin (CRESTOR) 5 MG tablet Take 1 tablet (5 mg total) by mouth 2 (two) times a week.   No facility-administered encounter  medications on file as of 12/09/2023.     Lab Results  Component Value Date   WBC 6.2 03/30/2023   HGB 15.5 03/30/2023   HCT 45.1 03/30/2023   PLT 211.0 03/30/2023   GLUCOSE 141 (H) 12/07/2023   CHOL 153 12/07/2023  TRIG 136.0 12/07/2023   HDL 33.70 (L) 12/07/2023   LDLDIRECT 103.9 05/03/2013   LDLCALC 92 12/07/2023   ALT 20 12/07/2023   AST 16 12/07/2023   NA 140 12/07/2023   K 4.0 12/07/2023   CL 104 12/07/2023   CREATININE 1.06 12/07/2023   BUN 19 12/07/2023   CO2 27 12/07/2023   TSH 1.26 03/30/2023   PSA 2.98 03/30/2023   INR 1.0 04/25/2011   HGBA1C 6.8 (H) 12/07/2023   MICROALBUR <0.7 07/28/2023    US Abdomen Complete Result Date: 01/21/2023 CLINICAL DATA:  Fatty liver, elevated bilirubin. EXAM: ABDOMEN ULTRASOUND COMPLETE COMPARISON:  January 14, 2008 FINDINGS: Gallbladder: No gallstones or wall thickening visualized. No sonographic Murphy sign noted by sonographer. Common bile duct: Diameter: 2.9 mm Liver: No focal lesion identified. Diffuse increased echotexture of the liver. Portal vein is patent on color Doppler imaging with normal direction of blood flow towards the liver. IVC: No abnormality visualized. Pancreas: Visualized portion unremarkable. Spleen: Size and appearance within normal limits. Right Kidney: Length: 10.9 cm. Echogenicity within normal limits. No mass or hydronephrosis visualized. Left Kidney: Length: 11 cm. Echogenicity within normal limits. No mass or hydronephrosis visualized. Abdominal aorta: No aneurysm visualized. Other findings: None. IMPRESSION: Diffuse increased echotexture of the liver. This is nonspecific but can be seen in fatty infiltration of liver. Electronically Signed   By: Sherian Rein M.D.   On: 01/21/2023 10:26       Assessment & Plan:  Health care maintenance Assessment & Plan: Physical scheduled.  Changed to f/u appt. Declined colonoscopy and cologuard.  PSA 03/30/23 - 2.98.  check psa with next labs.    Type 2 diabetes  mellitus with hyperglycemia, without long-term current use of insulin (HCC) Assessment & Plan: Low carb diet and exercise given elevated blood sugars.  Follow met b and a1c. Recommendation to start a cholesterol medication. Agreeable to restart low dose crestor - 5mg  2x/week.  Lab Results  Component Value Date   HGBA1C 6.8 (H) 12/07/2023    Orders: -     Hemoglobin A1c; Future  Hypercholesterolemia -     Lipid panel; Future -     Hepatic function panel; Future  Primary hypertension Assessment & Plan: Have discussed lisinopril/hydrochlorothiazide and bisoprolol/hydrochlorothiazide regimen.  He has been on this regimen for years.  Blood pressure has been well controlled.  Have previous discussed modifying medication. Have discussed increased lisinopril and possibly changing bisoprolol to carvedilol.  Given blood pressures controlled, will continue medication as he is doing.  Follow pressures.  Follow metabolic panel.   Orders: -     CBC with Differential/Platelet; Future -     Basic metabolic panel; Future -     TSH; Future  Prostate cancer screening -     PSA, Medicare; Future  Abnormal liver function tests Assessment & Plan: Recent liver pane wnl.    Other orders -     Rosuvastatin Calcium; Take 1 tablet (5 mg total) by mouth 2 (two) times a week.     Dale [place], MD

## 2023-12-09 NOTE — Assessment & Plan Note (Signed)
Recent liver pane wnl.

## 2023-12-09 NOTE — Assessment & Plan Note (Addendum)
Physical scheduled.  Changed to f/u appt. Declined colonoscopy and cologuard.  PSA 03/30/23 - 2.98.  check psa with next labs.

## 2023-12-09 NOTE — Assessment & Plan Note (Addendum)
Low carb diet and exercise given elevated blood sugars.  Follow met b and a1c. Recommendation to start a cholesterol medication. Agreeable to restart low dose crestor - 5mg  2x/week.  Lab Results  Component Value Date   HGBA1C 6.8 (H) 12/07/2023

## 2024-01-27 DIAGNOSIS — L814 Other melanin hyperpigmentation: Secondary | ICD-10-CM | POA: Diagnosis not present

## 2024-01-27 DIAGNOSIS — D225 Melanocytic nevi of trunk: Secondary | ICD-10-CM | POA: Diagnosis not present

## 2024-01-27 DIAGNOSIS — L821 Other seborrheic keratosis: Secondary | ICD-10-CM | POA: Diagnosis not present

## 2024-04-06 ENCOUNTER — Other Ambulatory Visit: Payer: Medicare Other

## 2024-04-08 ENCOUNTER — Ambulatory Visit: Payer: Medicare Other | Admitting: Internal Medicine

## 2024-04-14 ENCOUNTER — Other Ambulatory Visit (INDEPENDENT_AMBULATORY_CARE_PROVIDER_SITE_OTHER): Payer: Medicare Other

## 2024-04-14 DIAGNOSIS — Z125 Encounter for screening for malignant neoplasm of prostate: Secondary | ICD-10-CM

## 2024-04-14 DIAGNOSIS — I10 Essential (primary) hypertension: Secondary | ICD-10-CM | POA: Diagnosis not present

## 2024-04-14 DIAGNOSIS — E1165 Type 2 diabetes mellitus with hyperglycemia: Secondary | ICD-10-CM | POA: Diagnosis not present

## 2024-04-14 DIAGNOSIS — E7800 Pure hypercholesterolemia, unspecified: Secondary | ICD-10-CM | POA: Diagnosis not present

## 2024-04-14 LAB — HEMOGLOBIN A1C: Hgb A1c MFr Bld: 6.7 % — ABNORMAL HIGH (ref 4.6–6.5)

## 2024-04-14 LAB — LIPID PANEL
Cholesterol: 155 mg/dL (ref 0–200)
HDL: 30.9 mg/dL — ABNORMAL LOW (ref >39.00)
LDL Cholesterol: 83 mg/dL (ref 0–99)
NonHDL: 123.64
Total CHOL/HDL Ratio: 5
Triglycerides: 201 mg/dL — ABNORMAL HIGH (ref 0.0–149.0)
VLDL: 40.2 mg/dL — ABNORMAL HIGH (ref 0.0–40.0)

## 2024-04-14 LAB — CBC WITH DIFFERENTIAL/PLATELET
Basophils Absolute: 0 10*3/uL (ref 0.0–0.1)
Basophils Relative: 0.6 % (ref 0.0–3.0)
Eosinophils Absolute: 0.3 10*3/uL (ref 0.0–0.7)
Eosinophils Relative: 3.9 % (ref 0.0–5.0)
HCT: 44.2 % (ref 39.0–52.0)
Hemoglobin: 15.4 g/dL (ref 13.0–17.0)
Lymphocytes Relative: 39 % (ref 12.0–46.0)
Lymphs Abs: 2.9 10*3/uL (ref 0.7–4.0)
MCHC: 34.9 g/dL (ref 30.0–36.0)
MCV: 96.8 fl (ref 78.0–100.0)
Monocytes Absolute: 0.7 10*3/uL (ref 0.1–1.0)
Monocytes Relative: 9.1 % (ref 3.0–12.0)
Neutro Abs: 3.5 10*3/uL (ref 1.4–7.7)
Neutrophils Relative %: 47.4 % (ref 43.0–77.0)
Platelets: 238 10*3/uL (ref 150.0–400.0)
RBC: 4.56 Mil/uL (ref 4.22–5.81)
RDW: 13 % (ref 11.5–15.5)
WBC: 7.3 10*3/uL (ref 4.0–10.5)

## 2024-04-14 LAB — TSH: TSH: 1.46 u[IU]/mL (ref 0.35–5.50)

## 2024-04-14 LAB — HEPATIC FUNCTION PANEL
ALT: 25 U/L (ref 0–53)
AST: 23 U/L (ref 0–37)
Albumin: 4.7 g/dL (ref 3.5–5.2)
Alkaline Phosphatase: 64 U/L (ref 39–117)
Bilirubin, Direct: 0.2 mg/dL (ref 0.0–0.3)
Total Bilirubin: 1.4 mg/dL — ABNORMAL HIGH (ref 0.2–1.2)
Total Protein: 6.9 g/dL (ref 6.0–8.3)

## 2024-04-14 LAB — BASIC METABOLIC PANEL WITH GFR
BUN: 21 mg/dL (ref 6–23)
CO2: 26 meq/L (ref 19–32)
Calcium: 9.1 mg/dL (ref 8.4–10.5)
Chloride: 102 meq/L (ref 96–112)
Creatinine, Ser: 1.15 mg/dL (ref 0.40–1.50)
GFR: 63.88 mL/min (ref >60.00)
Glucose, Bld: 133 mg/dL — ABNORMAL HIGH (ref 70–99)
Potassium: 4.4 meq/L (ref 3.5–5.1)
Sodium: 137 meq/L (ref 135–145)

## 2024-04-14 LAB — PSA, MEDICARE: PSA: 3.96 ng/mL (ref 0.10–4.00)

## 2024-04-15 ENCOUNTER — Ambulatory Visit: Payer: Self-pay | Admitting: Internal Medicine

## 2024-04-18 ENCOUNTER — Ambulatory Visit (INDEPENDENT_AMBULATORY_CARE_PROVIDER_SITE_OTHER): Payer: Medicare Other | Admitting: Internal Medicine

## 2024-04-18 ENCOUNTER — Encounter: Payer: Self-pay | Admitting: Internal Medicine

## 2024-04-18 VITALS — BP 118/68 | HR 60 | Temp 98.2°F | Resp 16 | Ht 68.0 in | Wt 226.4 lb

## 2024-04-18 DIAGNOSIS — G720 Drug-induced myopathy: Secondary | ICD-10-CM | POA: Diagnosis not present

## 2024-04-18 DIAGNOSIS — Z1211 Encounter for screening for malignant neoplasm of colon: Secondary | ICD-10-CM | POA: Diagnosis not present

## 2024-04-18 DIAGNOSIS — Z125 Encounter for screening for malignant neoplasm of prostate: Secondary | ICD-10-CM

## 2024-04-18 DIAGNOSIS — R7989 Other specified abnormal findings of blood chemistry: Secondary | ICD-10-CM

## 2024-04-18 DIAGNOSIS — E1165 Type 2 diabetes mellitus with hyperglycemia: Secondary | ICD-10-CM

## 2024-04-18 DIAGNOSIS — T466X5A Adverse effect of antihyperlipidemic and antiarteriosclerotic drugs, initial encounter: Secondary | ICD-10-CM

## 2024-04-18 DIAGNOSIS — I10 Essential (primary) hypertension: Secondary | ICD-10-CM

## 2024-04-18 DIAGNOSIS — E7800 Pure hypercholesterolemia, unspecified: Secondary | ICD-10-CM

## 2024-04-18 LAB — HM DIABETES FOOT EXAM

## 2024-04-18 MED ORDER — EZETIMIBE 10 MG PO TABS: 10.0000 mg | ORAL_TABLET | Freq: Every day | ORAL | 2 refills | Status: DC

## 2024-04-18 NOTE — Progress Notes (Signed)
 Subjective:    Patient ID: Jamie Martin, male    DOB: 06/27/1951, 73 y.o.   MRN: [id number]  Patient here for  Chief Complaint  Patient presents with   Medical Management of Chronic Issues    HPI Here for a scheduled follow up - follow up regarding diabetes, hypertension and hypercholesterolemia. Last visit, discussed restarting crestor  5mg  2x/week.  He never started.  He desires not to take statin medication.  He has had problems with aching from statins.  Discussed other treatment options.  He is agreeable to start Zetia .  He stays active.  No chest pain or shortness of breath.  No abdominal pain or bowel change.  Blood pressure doing well.  Discussed recent lab results. Discussed colon cancer screening - declines colonoscopy and cologuard.    Past Medical History:  Diagnosis Date   Chicken pox    History of hyperglycemia    Hypertension    Plantar fasciitis    History of   Past Surgical History:  Procedure Laterality Date   arthroscopic knee     x2- Performed by Dr. by Josephus Nida   FOOT SURGERY  2009   KNEE ARTHROSCOPY  [id number]   right knee   PLANTAR FASCIECTOMY  2012   TONSILLECTOMY     TONSILLECTOMY AND ADENOIDECTOMY     Family History  Problem Relation Age of Onset   Hypertension Mother    Arthritis Mother    Stroke Father    Colon cancer Neg Hx    Prostate cancer Neg Hx    Social History   Socioeconomic History   Marital status: Married    Spouse name: Not on file   Number of children: Not on file   Years of education: Not on file   Highest education level: Bachelor's degree (e.g., BA, AB, BS)  Occupational History   Not on file  Tobacco Use   Smoking status: Former    Current packs/day: 0.00    Types: Cigarettes    Quit date: 11/17/2005    Years since quitting: 18.4   Smokeless tobacco: Never  Vaping Use   Vaping status: Never Used  Substance and Sexual Activity   Alcohol use: Yes    Alcohol/week: 0.0 standard drinks of alcohol   Drug  use: No   Sexual activity: Not on file  Other Topics Concern   Not on file  Social History Narrative   Married and has 2 children.   Social Drivers of Corporate investment banker Strain: Low Risk  (12/07/2023)   Overall Financial Resource Strain (CARDIA)    Difficulty of Paying Living Expenses: Not hard at all  Food Insecurity: No Food Insecurity (12/07/2023)   Hunger Vital Sign    Worried About Running Out of Food in the Last Year: Never true    Ran Out of Food in the Last Year: Never true  Transportation Needs: No Transportation Needs (12/07/2023)   PRAPARE - Administrator, Civil Service (Medical): No    Lack of Transportation (Non-Medical): No  Physical Activity: Inactive (12/07/2023)   Exercise Vital Sign    Days of Exercise per Week: 0 days    Minutes of Exercise per Session: 40 min  Stress: No Stress Concern Present (03/28/2023)   [employer] of Occupational Health - Occupational Stress Questionnaire    Feeling of Stress : Not at all  Social Connections: Unknown (12/07/2023)   Social Connection and Isolation Panel [NHANES]    Frequency of Communication with  Friends and Family: Twice a week    Frequency of Social Gatherings with Friends and Family: Twice a week    Attends Religious Services: Patient declined    Database administrator or Organizations: Yes    Attends Engineer, structural: More than 4 times per year    Marital Status: Married     Review of Systems  Constitutional:  Negative for appetite change and unexpected weight change.  HENT:  Negative for congestion and sinus pressure.   Respiratory:  Negative for cough, chest tightness and shortness of breath.   Cardiovascular:  Negative for chest pain, palpitations and leg swelling.  Gastrointestinal:  Negative for abdominal pain, diarrhea, nausea and vomiting.  Genitourinary:  Negative for difficulty urinating and dysuria.  Musculoskeletal:  Negative for joint swelling and myalgias.  Skin:   Negative for color change and rash.  Neurological:  Negative for dizziness and headaches.  Psychiatric/Behavioral:  Negative for agitation and dysphoric mood.        Objective:     BP 118/68   Pulse 60   Temp 98.2 F (36.8 C)   Resp 16   Ht 5\' 8"  (1.727 m)   Wt 226 lb 6.4 oz (102.7 kg)   SpO2 98%   BMI 34.42 kg/m  Wt Readings from Last 3 Encounters:  04/18/24 226 lb 6.4 oz (102.7 kg)  12/09/23 228 lb (103.4 kg)  07/30/23 218 lb (98.9 kg)    Physical Exam Vitals reviewed.  Constitutional:      General: He is not in acute distress.    Appearance: Normal appearance. He is well-developed.  HENT:     Head: Normocephalic and atraumatic.     Right Ear: External ear normal.     Left Ear: External ear normal.     Mouth/Throat:     Pharynx: No oropharyngeal exudate or posterior oropharyngeal erythema.  Eyes:     General: No scleral icterus.       Right eye: No discharge.        Left eye: No discharge.     Conjunctiva/sclera: Conjunctivae normal.  Cardiovascular:     Rate and Rhythm: Normal rate and regular rhythm.  Pulmonary:     Effort: Pulmonary effort is normal. No respiratory distress.     Breath sounds: Normal breath sounds.  Abdominal:     General: Bowel sounds are normal.     Palpations: Abdomen is soft.     Tenderness: There is no abdominal tenderness.  Musculoskeletal:        General: No swelling or tenderness.     Cervical back: Neck supple. No tenderness.  Lymphadenopathy:     Cervical: No cervical adenopathy.  Skin:    Findings: No erythema or rash.  Neurological:     Mental Status: He is alert.  Psychiatric:        Mood and Affect: Mood normal.        Behavior: Behavior normal.      Diabetic foot exam was performed with the following findings:   No deformities, ulcerations, or other skin breakdown Normal sensation of 10g monofilament Intact posterior tibialis and dorsalis pedis pulses      Outpatient Encounter Medications as of 04/18/2024   Medication Sig   ezetimibe  (ZETIA ) 10 MG tablet Take 1 tablet (10 mg total) by mouth daily.   Aspirin 81 MG EC tablet Take 81 mg by mouth daily.   bisoprolol -hydrochlorothiazide  (ZIAC ) 10-6.25 MG tablet TAKE 1 TABLET DAILY   lisinopril -hydrochlorothiazide  (ZESTORETIC ) 10-12.5  MG tablet TAKE 1 TABLET DAILY   [DISCONTINUED] rosuvastatin  (CRESTOR ) 5 MG tablet Take 1 tablet (5 mg total) by mouth 2 (two) times a week.   No facility-administered encounter medications on file as of 04/18/2024.     Lab Results  Component Value Date   WBC 7.3 04/14/2024   HGB 15.4 04/14/2024   HCT 44.2 04/14/2024   PLT 238.0 04/14/2024   GLUCOSE 133 (H) 04/14/2024   CHOL 155 04/14/2024   TRIG 201.0 (H) 04/14/2024   HDL 30.90 (L) 04/14/2024   LDLDIRECT 103.9 05/03/2013   LDLCALC 83 04/14/2024   ALT 25 04/14/2024   AST 23 04/14/2024   NA 137 04/14/2024   K 4.4 04/14/2024   CL 102 04/14/2024   CREATININE 1.15 04/14/2024   BUN 21 04/14/2024   CO2 26 04/14/2024   TSH 1.46 04/14/2024   PSA 3.96 04/14/2024   INR 1.0 04/25/2011   HGBA1C 6.7 (H) 04/14/2024   MICROALBUR <0.7 07/28/2023    US  Abdomen Complete Result Date: 01/21/2023 CLINICAL DATA:  Fatty liver, elevated bilirubin. EXAM: ABDOMEN ULTRASOUND COMPLETE COMPARISON:  January 14, 2008 FINDINGS: Gallbladder: No gallstones or wall thickening visualized. No sonographic Murphy sign noted by sonographer. Common bile duct: Diameter: 2.9 mm Liver: No focal lesion identified. Diffuse increased echotexture of the liver. Portal vein is patent on color Doppler imaging with normal direction of blood flow towards the liver. IVC: No abnormality visualized. Pancreas: Visualized portion unremarkable. Spleen: Size and appearance within normal limits. Right Kidney: Length: 10.9 cm. Echogenicity within normal limits. No mass or hydronephrosis visualized. Left Kidney: Length: 11 cm. Echogenicity within normal limits. No mass or hydronephrosis visualized. Abdominal aorta: No  aneurysm visualized. Other findings: None. IMPRESSION: Diffuse increased echotexture of the liver. This is nonspecific but can be seen in fatty infiltration of liver. Electronically Signed   By: Anna Barnes M.D.   On: 01/21/2023 10:26       Assessment & Plan:  Type 2 diabetes mellitus with hyperglycemia, without long-term current use of insulin (HCC) Assessment & Plan: Low carb diet and exercise given elevated blood sugars.  Follow met b and a1c. Recommendation to start a cholesterol medication. Agreeable to restart low dose crestor  - 5mg  2x/week - last visit. Discussed recent labs.  Lab Results  Component Value Date   HGBA1C 6.7 (H) 04/14/2024    Orders: -     Hemoglobin A1c; Future  Hypercholesterolemia -     Lipid panel; Future -     Hepatic function panel; Future  Primary hypertension Assessment & Plan: Have discussed lisinopril /hydrochlorothiazide  and bisoprolol /hydrochlorothiazide  regimen.  He has been on this regimen for years.  Blood pressure has been well controlled.  Have previous discussed modifying medication. Have discussed increased lisinopril  and possibly changing bisoprolol  to carvedilol.  Given blood pressures controlled, will continue medication as he is doing.  No changes. Follow pressures. Follow metabolic panel.   Orders: -     Basic metabolic panel with GFR; Future  Statin myopathy Assessment & Plan: Muscle aching with statin medication. Start zetia .    Prostate cancer screening -     PSA, Medicare; Future  Abnormal liver function tests Assessment & Plan: Recent liver panel - total bili 1.4. direct wnl. Remainder of liver panel wnl. Has been stable. Follow.    Colon cancer screening Assessment & Plan: Discussed. Declines cologuard and referral for colonoscopy.    Other orders -     Ezetimibe ; Take 1 tablet (10 mg total) by mouth daily.  Dispense: 30 tablet; Refill: 2     Dellar Fenton, MD

## 2024-04-18 NOTE — Assessment & Plan Note (Signed)
 Low carb diet and exercise given elevated blood sugars.  Follow met b and a1c. Recommendation to start a cholesterol medication. Agreeable to restart low dose crestor  - 5mg  2x/week - last visit. Discussed recent labs.  Lab Results  Component Value Date   HGBA1C 6.7 (H) 04/14/2024

## 2024-04-24 ENCOUNTER — Encounter: Payer: Self-pay | Admitting: Internal Medicine

## 2024-04-24 DIAGNOSIS — Z1211 Encounter for screening for malignant neoplasm of colon: Secondary | ICD-10-CM | POA: Insufficient documentation

## 2024-04-24 NOTE — Assessment & Plan Note (Signed)
 Muscle aching with statin medication. Start zetia .

## 2024-04-24 NOTE — Assessment & Plan Note (Signed)
 Have discussed lisinopril /hydrochlorothiazide  and bisoprolol /hydrochlorothiazide  regimen.  He has been on this regimen for years.  Blood pressure has been well controlled.  Have previous discussed modifying medication. Have discussed increased lisinopril  and possibly changing bisoprolol  to carvedilol.  Given blood pressures controlled, will continue medication as he is doing.  No changes. Follow pressures. Follow metabolic panel.

## 2024-04-24 NOTE — Assessment & Plan Note (Signed)
 Discussed. Declines cologuard and referral for colonoscopy.

## 2024-04-24 NOTE — Assessment & Plan Note (Signed)
 Recent liver panel - total bili 1.4. direct wnl. Remainder of liver panel wnl. Has been stable. Follow.

## 2024-07-10 ENCOUNTER — Encounter: Payer: Self-pay | Admitting: Internal Medicine

## 2024-07-11 ENCOUNTER — Other Ambulatory Visit: Payer: Self-pay

## 2024-07-11 MED ORDER — EZETIMIBE 10 MG PO TABS: 10.0000 mg | ORAL_TABLET | Freq: Every day | ORAL | 3 refills | Status: AC

## 2024-07-13 ENCOUNTER — Other Ambulatory Visit: Payer: Self-pay | Admitting: Internal Medicine

## 2024-08-08 DIAGNOSIS — M5459 Other low back pain: Secondary | ICD-10-CM | POA: Diagnosis not present

## 2024-08-08 DIAGNOSIS — Z96651 Presence of right artificial knee joint: Secondary | ICD-10-CM | POA: Diagnosis not present

## 2024-08-08 DIAGNOSIS — M1611 Unilateral primary osteoarthritis, right hip: Secondary | ICD-10-CM | POA: Diagnosis not present

## 2024-08-09 ENCOUNTER — Encounter: Payer: Self-pay | Admitting: Internal Medicine

## 2024-08-09 DIAGNOSIS — M545 Low back pain, unspecified: Secondary | ICD-10-CM | POA: Insufficient documentation

## 2024-08-26 DIAGNOSIS — M5459 Other low back pain: Secondary | ICD-10-CM | POA: Diagnosis not present

## 2024-08-29 DIAGNOSIS — M5126 Other intervertebral disc displacement, lumbar region: Secondary | ICD-10-CM | POA: Diagnosis not present

## 2024-08-29 DIAGNOSIS — M129 Arthropathy, unspecified: Secondary | ICD-10-CM | POA: Diagnosis not present

## 2024-08-29 DIAGNOSIS — M48061 Spinal stenosis, lumbar region without neurogenic claudication: Secondary | ICD-10-CM | POA: Diagnosis not present

## 2024-08-29 DIAGNOSIS — M51369 Other intervertebral disc degeneration, lumbar region without mention of lumbar back pain or lower extremity pain: Secondary | ICD-10-CM | POA: Diagnosis not present

## 2024-08-29 DIAGNOSIS — M4316 Spondylolisthesis, lumbar region: Secondary | ICD-10-CM | POA: Diagnosis not present

## 2024-08-29 DIAGNOSIS — M5459 Other low back pain: Secondary | ICD-10-CM | POA: Diagnosis not present

## 2024-08-29 DIAGNOSIS — M5127 Other intervertebral disc displacement, lumbosacral region: Secondary | ICD-10-CM | POA: Diagnosis not present

## 2024-08-29 DIAGNOSIS — M2428 Disorder of ligament, vertebrae: Secondary | ICD-10-CM | POA: Diagnosis not present

## 2024-08-29 DIAGNOSIS — R609 Edema, unspecified: Secondary | ICD-10-CM | POA: Diagnosis not present

## 2024-08-29 DIAGNOSIS — M47816 Spondylosis without myelopathy or radiculopathy, lumbar region: Secondary | ICD-10-CM | POA: Diagnosis not present

## 2024-09-05 ENCOUNTER — Other Ambulatory Visit

## 2024-09-05 DIAGNOSIS — E7800 Pure hypercholesterolemia, unspecified: Secondary | ICD-10-CM | POA: Diagnosis not present

## 2024-09-05 DIAGNOSIS — Z125 Encounter for screening for malignant neoplasm of prostate: Secondary | ICD-10-CM | POA: Diagnosis not present

## 2024-09-05 DIAGNOSIS — I10 Essential (primary) hypertension: Secondary | ICD-10-CM | POA: Diagnosis not present

## 2024-09-05 DIAGNOSIS — E1165 Type 2 diabetes mellitus with hyperglycemia: Secondary | ICD-10-CM

## 2024-09-05 DIAGNOSIS — M48061 Spinal stenosis, lumbar region without neurogenic claudication: Secondary | ICD-10-CM | POA: Diagnosis not present

## 2024-09-05 LAB — HEPATIC FUNCTION PANEL
ALT: 19 U/L (ref 0–53)
AST: 18 U/L (ref 0–37)
Albumin: 4.3 g/dL (ref 3.5–5.2)
Alkaline Phosphatase: 65 U/L (ref 39–117)
Bilirubin, Direct: 0.1 mg/dL (ref 0.0–0.3)
Total Bilirubin: 0.8 mg/dL (ref 0.2–1.2)
Total Protein: 6.3 g/dL (ref 6.0–8.3)

## 2024-09-05 LAB — BASIC METABOLIC PANEL WITH GFR
BUN: 24 mg/dL — ABNORMAL HIGH (ref 6–23)
CO2: 24 meq/L (ref 19–32)
Calcium: 8.8 mg/dL (ref 8.4–10.5)
Chloride: 105 meq/L (ref 96–112)
Creatinine, Ser: 1.13 mg/dL (ref 0.40–1.50)
GFR: 65.06 mL/min (ref >60.00)
Glucose, Bld: 142 mg/dL — ABNORMAL HIGH (ref 70–99)
Potassium: 4.4 meq/L (ref 3.5–5.1)
Sodium: 138 meq/L (ref 135–145)

## 2024-09-05 LAB — LIPID PANEL
Cholesterol: 151 mg/dL (ref 0–200)
HDL: 32.8 mg/dL — ABNORMAL LOW (ref >39.00)
LDL Cholesterol: 90 mg/dL (ref 0–99)
NonHDL: 118.6
Total CHOL/HDL Ratio: 5
Triglycerides: 141 mg/dL (ref 0.0–149.0)
VLDL: 28.2 mg/dL (ref 0.0–40.0)

## 2024-09-05 LAB — PSA, MEDICARE: PSA: 3.43 ng/mL (ref 0.10–4.00)

## 2024-09-05 LAB — HEMOGLOBIN A1C: Hgb A1c MFr Bld: 7 % — ABNORMAL HIGH (ref 4.6–6.5)

## 2024-09-06 ENCOUNTER — Ambulatory Visit: Payer: Self-pay | Admitting: Internal Medicine

## 2024-09-07 ENCOUNTER — Encounter: Payer: Self-pay | Admitting: Internal Medicine

## 2024-09-07 ENCOUNTER — Ambulatory Visit: Admitting: Internal Medicine

## 2024-09-07 VITALS — BP 118/62 | HR 51 | Temp 98.1°F | Ht 68.0 in | Wt 227.2 lb

## 2024-09-07 DIAGNOSIS — T466X5A Adverse effect of antihyperlipidemic and antiarteriosclerotic drugs, initial encounter: Secondary | ICD-10-CM | POA: Diagnosis not present

## 2024-09-07 DIAGNOSIS — E1165 Type 2 diabetes mellitus with hyperglycemia: Secondary | ICD-10-CM | POA: Diagnosis not present

## 2024-09-07 DIAGNOSIS — I10 Essential (primary) hypertension: Secondary | ICD-10-CM

## 2024-09-07 DIAGNOSIS — E7800 Pure hypercholesterolemia, unspecified: Secondary | ICD-10-CM

## 2024-09-07 DIAGNOSIS — G720 Drug-induced myopathy: Secondary | ICD-10-CM

## 2024-09-07 DIAGNOSIS — Z0000 Encounter for general adult medical examination without abnormal findings: Secondary | ICD-10-CM

## 2024-09-07 LAB — MICROALBUMIN / CREATININE URINE RATIO
Creatinine,U: 105.8 mg/dL
Microalb Creat Ratio: UNDETERMINED mg/g (ref 0.0–30.0)
Microalb, Ur: 0.7 mg/dL (ref 0.0–1.9)

## 2024-09-07 NOTE — Progress Notes (Signed)
 Subjective:    Patient ID: Jamie Martin, male    DOB: Nov 25, 1950, 73 y.o.   MRN: [id number]  Patient here for  Chief Complaint  Patient presents with   Medical Management of Chronic Issues    HPI With past history of diabetes, hypercholesterolemia and hypertension. He comes in today to follow up on these issues as well as for a complete physical exam. Started zetia  last visit. Has intolerance to statin therapy. Tolerating zetia . Saw ortho 08/08/24- right hip OA, right knee and low back pain with radiculopathy. Treated with steroid dose pak. Did not help. F/u 08/26/24 - f/u right hip OA and low back pain - recommended to start gabapentin, tylenol  and meloxicam. MRI ordered. Back exercises. Discussed labs. A1c increased - 7.0. discussed treatment options. Discussed diet and exercise.    Past Medical History:  Diagnosis Date   Chicken pox    History of hyperglycemia    Hypertension    Plantar fasciitis    History of   Past Surgical History:  Procedure Laterality Date   arthroscopic knee     x2- Performed by Dr. by Helayne Glenn   FOOT SURGERY  2009   KNEE ARTHROSCOPY  [id number]   right knee   PLANTAR FASCIECTOMY  2012   TONSILLECTOMY     TONSILLECTOMY AND ADENOIDECTOMY     Family History  Problem Relation Age of Onset   Hypertension Mother    Arthritis Mother    Stroke Father    Colon cancer Neg Hx    Prostate cancer Neg Hx    Social History   Socioeconomic History   Marital status: Married    Spouse name: Not on file   Number of children: Not on file   Years of education: Not on file   Highest education level: Bachelor's degree (e.g., BA, AB, BS)  Occupational History   Not on file  Tobacco Use   Smoking status: Former    Current packs/day: 0.00    Types: Cigarettes    Quit date: 11/17/2005    Years since quitting: 18.8   Smokeless tobacco: Never  Vaping Use   Vaping status: Never Used  Substance and Sexual Activity   Alcohol use: Yes    Alcohol/week:  0.0 standard drinks of alcohol   Drug use: No   Sexual activity: Not on file  Other Topics Concern   Not on file  Social History Narrative   Married and has 2 children.   Social Drivers of Corporate Investment Banker Strain: Low Risk  (12/07/2023)   Overall Financial Resource Strain (CARDIA)    Difficulty of Paying Living Expenses: Not hard at all  Food Insecurity: No Food Insecurity (12/07/2023)   Hunger Vital Sign    Worried About Running Out of Food in the Last Year: Never true    Ran Out of Food in the Last Year: Never true  Transportation Needs: No Transportation Needs (12/07/2023)   PRAPARE - Administrator, Civil Service (Medical): No    Lack of Transportation (Non-Medical): No  Physical Activity: Unknown (12/07/2023)   Exercise Vital Sign    Days of Exercise per Week: 0 days    Minutes of Exercise per Session: Not on file  Recent Concern: Physical Activity - Inactive (12/07/2023)   Exercise Vital Sign    Days of Exercise per Week: 0 days    Minutes of Exercise per Session: 40 min  Stress: No Stress Concern Present (03/28/2023)   [employer]  of Occupational Health - Occupational Stress Questionnaire    Feeling of Stress : Not at all  Social Connections: Unknown (12/07/2023)   Social Connection and Isolation Panel    Frequency of Communication with Friends and Family: Twice a week    Frequency of Social Gatherings with Friends and Family: Twice a week    Attends Religious Services: Patient declined    Database Administrator or Organizations: Yes    Attends Engineer, Structural: More than 4 times per year    Marital Status: Married     Review of Systems  Constitutional:  Negative for appetite change and unexpected weight change.  HENT:  Negative for congestion, sinus pressure and sore throat.   Eyes:  Negative for pain and visual disturbance.  Respiratory:  Negative for cough, chest tightness and shortness of breath.   Cardiovascular:  Negative  for chest pain, palpitations and leg swelling.  Gastrointestinal:  Negative for abdominal pain, diarrhea, nausea and vomiting.  Genitourinary:  Negative for difficulty urinating and dysuria.  Musculoskeletal:  Negative for joint swelling and myalgias.  Skin:  Negative for color change and rash.  Neurological:  Negative for dizziness and headaches.  Hematological:  Negative for adenopathy. Does not bruise/bleed easily.  Psychiatric/Behavioral:  Negative for agitation and dysphoric mood.        Objective:     BP 118/62   Pulse (!) 51   Temp 98.1 F (36.7 C)   Ht 5' 8 (1.727 m)   Wt 227 lb 3.2 oz (103.1 kg)   SpO2 98%   BMI 34.55 kg/m  Wt Readings from Last 3 Encounters:  09/07/24 227 lb 3.2 oz (103.1 kg)  04/18/24 226 lb 6.4 oz (102.7 kg)  12/09/23 228 lb (103.4 kg)    Physical Exam Constitutional:      General: He is not in acute distress.    Appearance: Normal appearance. He is well-developed.  HENT:     Head: Normocephalic and atraumatic.     Right Ear: External ear normal.     Left Ear: External ear normal.     Mouth/Throat:     Pharynx: No oropharyngeal exudate or posterior oropharyngeal erythema.  Eyes:     General: No scleral icterus.       Right eye: No discharge.        Left eye: No discharge.     Conjunctiva/sclera: Conjunctivae normal.  Neck:     Thyroid : No thyromegaly.  Cardiovascular:     Rate and Rhythm: Normal rate and regular rhythm.  Pulmonary:     Effort: No respiratory distress.     Breath sounds: Normal breath sounds. No wheezing.  Abdominal:     General: Bowel sounds are normal.     Palpations: Abdomen is soft.     Tenderness: There is no abdominal tenderness.  Musculoskeletal:        General: No swelling or tenderness.     Cervical back: Neck supple. No tenderness.  Lymphadenopathy:     Cervical: No cervical adenopathy.  Skin:    Findings: No erythema or rash.  Neurological:     Mental Status: He is alert and oriented to person,  place, and time.  Psychiatric:        Mood and Affect: Mood normal.        Behavior: Behavior normal.         Outpatient Encounter Medications as of 09/07/2024  Medication Sig   Aspirin 81 MG EC tablet Take 81 mg  by mouth daily.   bisoprolol -hydrochlorothiazide  (ZIAC ) 10-6.25 MG tablet TAKE 1 TABLET DAILY   ezetimibe  (ZETIA ) 10 MG tablet Take 1 tablet (10 mg total) by mouth daily.   lisinopril -hydrochlorothiazide  (ZESTORETIC ) 10-12.5 MG tablet TAKE 1 TABLET DAILY   No facility-administered encounter medications on file as of 09/07/2024.     Lab Results  Component Value Date   WBC 7.3 04/14/2024   HGB 15.4 04/14/2024   HCT 44.2 04/14/2024   PLT 238.0 04/14/2024   GLUCOSE 142 (H) 09/05/2024   CHOL 151 09/05/2024   TRIG 141.0 09/05/2024   HDL 32.80 (L) 09/05/2024   LDLDIRECT 103.9 05/03/2013   LDLCALC 90 09/05/2024   ALT 19 09/05/2024   AST 18 09/05/2024   NA 138 09/05/2024   K 4.4 09/05/2024   CL 105 09/05/2024   CREATININE 1.13 09/05/2024   BUN 24 (H) 09/05/2024   CO2 24 09/05/2024   TSH 1.46 04/14/2024   PSA 3.43 09/05/2024   INR 1.0 04/25/2011   HGBA1C 7.0 (H) 09/05/2024   MICROALBUR 0.7 09/07/2024    US  Abdomen Complete Result Date: 01/21/2023 CLINICAL DATA:  Fatty liver, elevated bilirubin. EXAM: ABDOMEN ULTRASOUND COMPLETE COMPARISON:  January 14, 2008 FINDINGS: Gallbladder: No gallstones or wall thickening visualized. No sonographic Murphy sign noted by sonographer. Common bile duct: Diameter: 2.9 mm Liver: No focal lesion identified. Diffuse increased echotexture of the liver. Portal vein is patent on color Doppler imaging with normal direction of blood flow towards the liver. IVC: No abnormality visualized. Pancreas: Visualized portion unremarkable. Spleen: Size and appearance within normal limits. Right Kidney: Length: 10.9 cm. Echogenicity within normal limits. No mass or hydronephrosis visualized. Left Kidney: Length: 11 cm. Echogenicity within normal limits.  No mass or hydronephrosis visualized. Abdominal aorta: No aneurysm visualized. Other findings: None. IMPRESSION: Diffuse increased echotexture of the liver. This is nonspecific but can be seen in fatty infiltration of liver. Electronically Signed   By: Craig Farr M.D.   On: 01/21/2023 10:26       Assessment & Plan:  Routine general medical examination at a health care facility  Type 2 diabetes mellitus with hyperglycemia, without long-term current use of insulin (HCC) Assessment & Plan: Low carb diet and exercise given elevated blood sugars.  Discussed treatment options. He declines to start. Follow met b and a1c.  Lab Results  Component Value Date   HGBA1C 7.0 (H) 09/05/2024    Orders: -     Hemoglobin A1c; Future -     Microalbumin / creatinine urine ratio; Future  Hypercholesterolemia -     Hepatic function panel; Future -     Lipid panel; Future  Primary hypertension Assessment & Plan: Have discussed lisinopril /hydrochlorothiazide  and bisoprolol /hydrochlorothiazide  regimen.  He has been on this regimen for years.  Blood pressure has been well controlled.  Have previous discussed modifying medication. Have discussed increased lisinopril  and possibly changing bisoprolol  to carvedilol.  Given blood pressures controlled, will continue medication as he is doing.  No changes. Follow pressures. Follow metabolic panel.   Orders: -     Basic metabolic panel with GFR; Future  Statin myopathy Assessment & Plan: Intolerant to statin medication. Taking zetia . Tolerating.    Health care maintenance Assessment & Plan: Physical today 09/07/24. Declines colonoscopy. Declines cologuard. PSA 09/05/24 - 3.43.       Allena Hamilton, MD

## 2024-09-08 ENCOUNTER — Ambulatory Visit: Payer: Self-pay | Admitting: Internal Medicine

## 2024-09-11 ENCOUNTER — Encounter: Payer: Self-pay | Admitting: Internal Medicine

## 2024-09-11 NOTE — Assessment & Plan Note (Signed)
 Intolerant to statin medication. Taking zetia . Tolerating.

## 2024-09-11 NOTE — Assessment & Plan Note (Signed)
 Physical today 09/07/24. Declines colonoscopy. Declines cologuard. PSA 09/05/24 - 3.43.

## 2024-09-11 NOTE — Assessment & Plan Note (Signed)
 Have discussed lisinopril /hydrochlorothiazide  and bisoprolol /hydrochlorothiazide  regimen.  He has been on this regimen for years.  Blood pressure has been well controlled.  Have previous discussed modifying medication. Have discussed increased lisinopril  and possibly changing bisoprolol  to carvedilol.  Given blood pressures controlled, will continue medication as he is doing.  No changes. Follow pressures. Follow metabolic panel.

## 2024-09-11 NOTE — Assessment & Plan Note (Signed)
 Low carb diet and exercise given elevated blood sugars.  Discussed treatment options. He declines to start. Follow met b and a1c.  Lab Results  Component Value Date   HGBA1C 7.0 (H) 09/05/2024

## 2024-09-16 DIAGNOSIS — M5416 Radiculopathy, lumbar region: Secondary | ICD-10-CM | POA: Diagnosis not present

## 2024-09-30 DIAGNOSIS — M5416 Radiculopathy, lumbar region: Secondary | ICD-10-CM | POA: Diagnosis not present

## 2024-10-12 DIAGNOSIS — M5416 Radiculopathy, lumbar region: Secondary | ICD-10-CM | POA: Diagnosis not present

## 2024-10-24 DIAGNOSIS — M5416 Radiculopathy, lumbar region: Secondary | ICD-10-CM | POA: Diagnosis not present

## 2025-01-09 ENCOUNTER — Other Ambulatory Visit

## 2025-01-11 ENCOUNTER — Ambulatory Visit: Admitting: Internal Medicine
